# Patient Record
Sex: Female | Born: 1955 | Race: White | Hispanic: No | Marital: Married | Smoking: Never smoker
Health system: Southern US, Community
[De-identification: ages and names within clinical notes are randomized; demographics above are authoritative.]

## PROBLEM LIST (undated history)

## (undated) DIAGNOSIS — G8929 Other chronic pain: Secondary | ICD-10-CM

## (undated) DIAGNOSIS — M542 Cervicalgia: Secondary | ICD-10-CM

## (undated) DIAGNOSIS — M1990 Unspecified osteoarthritis, unspecified site: Secondary | ICD-10-CM

## (undated) DIAGNOSIS — F32A Depression, unspecified: Secondary | ICD-10-CM

## (undated) DIAGNOSIS — F419 Anxiety disorder, unspecified: Secondary | ICD-10-CM

## (undated) DIAGNOSIS — T7840XA Allergy, unspecified, initial encounter: Secondary | ICD-10-CM

## (undated) DIAGNOSIS — F329 Major depressive disorder, single episode, unspecified: Secondary | ICD-10-CM

## (undated) DIAGNOSIS — M549 Dorsalgia, unspecified: Secondary | ICD-10-CM

## (undated) DIAGNOSIS — I10 Essential (primary) hypertension: Secondary | ICD-10-CM

## (undated) DIAGNOSIS — E7800 Pure hypercholesterolemia, unspecified: Secondary | ICD-10-CM

## (undated) DIAGNOSIS — K649 Unspecified hemorrhoids: Secondary | ICD-10-CM

## (undated) DIAGNOSIS — M810 Age-related osteoporosis without current pathological fracture: Secondary | ICD-10-CM

## (undated) DIAGNOSIS — K219 Gastro-esophageal reflux disease without esophagitis: Secondary | ICD-10-CM

## (undated) HISTORY — DX: Unspecified hemorrhoids: K64.9

## (undated) HISTORY — DX: Allergy, unspecified, initial encounter: T78.40XA

## (undated) HISTORY — DX: Other chronic pain: G89.29

## (undated) HISTORY — PX: SPINE SURGERY: SHX786

## (undated) HISTORY — PX: CHOLECYSTECTOMY: SHX55

## (undated) HISTORY — DX: Age-related osteoporosis without current pathological fracture: M81.0

## (undated) HISTORY — PX: NECK SURGERY: SHX720

## (undated) HISTORY — PX: APPENDECTOMY: SHX54

## (undated) HISTORY — DX: Unspecified osteoarthritis, unspecified site: M19.90

## (undated) HISTORY — PX: ABDOMINAL HYSTERECTOMY: SHX81

## (undated) HISTORY — PX: HERNIA REPAIR: SHX51

---

## 1998-03-10 ENCOUNTER — Ambulatory Visit (HOSPITAL_COMMUNITY): Admission: RE | Admit: 1998-03-10 | Discharge: 1998-03-12 | Payer: Self-pay | Admitting: Neurosurgery

## 1998-04-26 ENCOUNTER — Encounter: Admission: RE | Admit: 1998-04-26 | Discharge: 1998-07-25 | Payer: Self-pay | Admitting: Neurosurgery

## 1998-05-03 ENCOUNTER — Ambulatory Visit (HOSPITAL_COMMUNITY): Admission: RE | Admit: 1998-05-03 | Discharge: 1998-05-03 | Payer: Self-pay | Admitting: Neurosurgery

## 1998-06-12 ENCOUNTER — Encounter: Admission: RE | Admit: 1998-06-12 | Discharge: 1998-09-10 | Payer: Self-pay | Admitting: Neurosurgery

## 2000-01-31 ENCOUNTER — Ambulatory Visit (HOSPITAL_BASED_OUTPATIENT_CLINIC_OR_DEPARTMENT_OTHER): Admission: RE | Admit: 2000-01-31 | Discharge: 2000-01-31 | Payer: Self-pay | Admitting: Surgery

## 2000-04-07 ENCOUNTER — Encounter: Payer: Self-pay | Admitting: Neurosurgery

## 2000-04-07 ENCOUNTER — Ambulatory Visit (HOSPITAL_COMMUNITY): Admission: RE | Admit: 2000-04-07 | Discharge: 2000-04-07 | Payer: Self-pay | Admitting: Neurosurgery

## 2000-07-22 ENCOUNTER — Encounter (INDEPENDENT_AMBULATORY_CARE_PROVIDER_SITE_OTHER): Payer: Self-pay | Admitting: Specialist

## 2000-07-22 ENCOUNTER — Other Ambulatory Visit: Admission: RE | Admit: 2000-07-22 | Discharge: 2000-07-22 | Payer: Self-pay | Admitting: Obstetrics and Gynecology

## 2000-07-22 ENCOUNTER — Encounter: Payer: Self-pay | Admitting: Obstetrics and Gynecology

## 2000-07-22 ENCOUNTER — Ambulatory Visit (HOSPITAL_COMMUNITY): Admission: RE | Admit: 2000-07-22 | Discharge: 2000-07-22 | Payer: Self-pay | Admitting: Obstetrics and Gynecology

## 2000-09-15 ENCOUNTER — Other Ambulatory Visit: Admission: RE | Admit: 2000-09-15 | Discharge: 2000-09-15 | Payer: Self-pay | Admitting: Obstetrics and Gynecology

## 2000-09-24 ENCOUNTER — Encounter (INDEPENDENT_AMBULATORY_CARE_PROVIDER_SITE_OTHER): Payer: Self-pay | Admitting: Specialist

## 2000-09-24 ENCOUNTER — Inpatient Hospital Stay (HOSPITAL_COMMUNITY): Admission: RE | Admit: 2000-09-24 | Discharge: 2000-09-25 | Payer: Self-pay | Admitting: Obstetrics and Gynecology

## 2001-06-16 ENCOUNTER — Encounter: Payer: Self-pay | Admitting: *Deleted

## 2001-06-16 ENCOUNTER — Ambulatory Visit (HOSPITAL_COMMUNITY): Admission: RE | Admit: 2001-06-16 | Discharge: 2001-06-16 | Payer: Self-pay | Admitting: *Deleted

## 2001-12-10 ENCOUNTER — Ambulatory Visit (HOSPITAL_COMMUNITY): Admission: RE | Admit: 2001-12-10 | Discharge: 2001-12-10 | Payer: Self-pay | Admitting: Internal Medicine

## 2001-12-10 ENCOUNTER — Encounter: Payer: Self-pay | Admitting: Internal Medicine

## 2002-01-05 ENCOUNTER — Ambulatory Visit (HOSPITAL_COMMUNITY): Admission: RE | Admit: 2002-01-05 | Discharge: 2002-01-05 | Payer: Self-pay | Admitting: Internal Medicine

## 2002-01-18 ENCOUNTER — Ambulatory Visit (HOSPITAL_COMMUNITY): Admission: RE | Admit: 2002-01-18 | Discharge: 2002-01-18 | Payer: Self-pay | Admitting: Internal Medicine

## 2002-01-18 ENCOUNTER — Encounter (INDEPENDENT_AMBULATORY_CARE_PROVIDER_SITE_OTHER): Payer: Self-pay | Admitting: Internal Medicine

## 2002-09-17 ENCOUNTER — Encounter: Payer: Self-pay | Admitting: Internal Medicine

## 2002-09-17 ENCOUNTER — Ambulatory Visit (HOSPITAL_COMMUNITY): Admission: RE | Admit: 2002-09-17 | Discharge: 2002-09-17 | Payer: Self-pay | Admitting: Internal Medicine

## 2003-03-24 ENCOUNTER — Encounter: Payer: Self-pay | Admitting: Family Medicine

## 2003-03-24 ENCOUNTER — Ambulatory Visit (HOSPITAL_COMMUNITY): Admission: RE | Admit: 2003-03-24 | Discharge: 2003-03-24 | Payer: Self-pay | Admitting: Family Medicine

## 2004-07-03 ENCOUNTER — Emergency Department (HOSPITAL_COMMUNITY): Admission: EM | Admit: 2004-07-03 | Discharge: 2004-07-03 | Payer: Self-pay | Admitting: Emergency Medicine

## 2004-07-14 ENCOUNTER — Emergency Department (HOSPITAL_COMMUNITY): Admission: EM | Admit: 2004-07-14 | Discharge: 2004-07-14 | Payer: Self-pay | Admitting: Emergency Medicine

## 2004-07-31 ENCOUNTER — Emergency Department (HOSPITAL_COMMUNITY): Admission: EM | Admit: 2004-07-31 | Discharge: 2004-07-31 | Payer: Self-pay | Admitting: Emergency Medicine

## 2004-12-11 ENCOUNTER — Encounter: Admission: RE | Admit: 2004-12-11 | Discharge: 2004-12-11 | Payer: Self-pay | Admitting: Family Medicine

## 2004-12-24 ENCOUNTER — Encounter: Admission: RE | Admit: 2004-12-24 | Discharge: 2004-12-24 | Payer: Self-pay | Admitting: Family Medicine

## 2005-02-05 ENCOUNTER — Encounter: Admission: RE | Admit: 2005-02-05 | Discharge: 2005-02-05 | Payer: Self-pay | Admitting: Family Medicine

## 2005-10-07 ENCOUNTER — Ambulatory Visit (HOSPITAL_COMMUNITY): Admission: RE | Admit: 2005-10-07 | Discharge: 2005-10-07 | Payer: Self-pay | Admitting: Neurosurgery

## 2006-02-19 ENCOUNTER — Encounter: Admission: RE | Admit: 2006-02-19 | Discharge: 2006-02-19 | Payer: Self-pay | Admitting: Family Medicine

## 2006-07-22 ENCOUNTER — Ambulatory Visit (HOSPITAL_COMMUNITY): Admission: RE | Admit: 2006-07-22 | Discharge: 2006-07-22 | Payer: Self-pay | Admitting: Family Medicine

## 2006-08-13 ENCOUNTER — Ambulatory Visit (HOSPITAL_COMMUNITY): Admission: RE | Admit: 2006-08-13 | Discharge: 2006-08-13 | Payer: Self-pay | Admitting: Neurosurgery

## 2007-02-23 ENCOUNTER — Encounter: Admission: RE | Admit: 2007-02-23 | Discharge: 2007-02-23 | Payer: Self-pay | Admitting: Family Medicine

## 2007-08-25 ENCOUNTER — Emergency Department (HOSPITAL_COMMUNITY): Admission: EM | Admit: 2007-08-25 | Discharge: 2007-08-25 | Payer: Self-pay | Admitting: Emergency Medicine

## 2007-10-15 ENCOUNTER — Ambulatory Visit (HOSPITAL_COMMUNITY): Admission: RE | Admit: 2007-10-15 | Discharge: 2007-10-15 | Payer: Self-pay | Admitting: Family Medicine

## 2007-11-17 ENCOUNTER — Ambulatory Visit (HOSPITAL_COMMUNITY): Admission: RE | Admit: 2007-11-17 | Discharge: 2007-11-17 | Payer: Self-pay | Admitting: Family Medicine

## 2007-11-26 HISTORY — PX: COLONOSCOPY: SHX174

## 2007-11-26 HISTORY — PX: ESOPHAGOGASTRODUODENOSCOPY: SHX1529

## 2008-01-04 ENCOUNTER — Ambulatory Visit (HOSPITAL_COMMUNITY): Admission: RE | Admit: 2008-01-04 | Discharge: 2008-01-04 | Payer: Self-pay | Admitting: Family Medicine

## 2008-01-28 ENCOUNTER — Ambulatory Visit (HOSPITAL_COMMUNITY): Admission: RE | Admit: 2008-01-28 | Discharge: 2008-01-28 | Payer: Self-pay | Admitting: Family Medicine

## 2008-03-13 ENCOUNTER — Emergency Department (HOSPITAL_COMMUNITY): Admission: EM | Admit: 2008-03-13 | Discharge: 2008-03-13 | Payer: Self-pay | Admitting: Emergency Medicine

## 2008-04-01 ENCOUNTER — Ambulatory Visit: Payer: Self-pay | Admitting: Internal Medicine

## 2008-04-05 ENCOUNTER — Ambulatory Visit: Payer: Self-pay | Admitting: Internal Medicine

## 2008-04-05 ENCOUNTER — Ambulatory Visit (HOSPITAL_COMMUNITY): Admission: RE | Admit: 2008-04-05 | Discharge: 2008-04-05 | Payer: Self-pay | Admitting: Internal Medicine

## 2008-05-04 ENCOUNTER — Ambulatory Visit: Payer: Self-pay | Admitting: Internal Medicine

## 2008-05-06 ENCOUNTER — Encounter (HOSPITAL_COMMUNITY): Admission: RE | Admit: 2008-05-06 | Discharge: 2008-06-05 | Payer: Self-pay | Admitting: Internal Medicine

## 2008-05-18 ENCOUNTER — Encounter (INDEPENDENT_AMBULATORY_CARE_PROVIDER_SITE_OTHER): Payer: Self-pay | Admitting: General Surgery

## 2008-05-18 ENCOUNTER — Observation Stay (HOSPITAL_COMMUNITY): Admission: RE | Admit: 2008-05-18 | Discharge: 2008-05-19 | Payer: Self-pay | Admitting: General Surgery

## 2009-03-13 ENCOUNTER — Ambulatory Visit (HOSPITAL_COMMUNITY): Admission: RE | Admit: 2009-03-13 | Discharge: 2009-03-13 | Payer: Self-pay | Admitting: Family Medicine

## 2009-05-05 ENCOUNTER — Emergency Department (HOSPITAL_COMMUNITY): Admission: EM | Admit: 2009-05-05 | Discharge: 2009-05-05 | Payer: Self-pay | Admitting: Emergency Medicine

## 2009-11-11 ENCOUNTER — Emergency Department (HOSPITAL_COMMUNITY): Admission: EM | Admit: 2009-11-11 | Discharge: 2009-11-11 | Payer: Self-pay | Admitting: Emergency Medicine

## 2010-03-15 ENCOUNTER — Ambulatory Visit (HOSPITAL_COMMUNITY): Admission: RE | Admit: 2010-03-15 | Discharge: 2010-03-15 | Payer: Self-pay | Admitting: Family Medicine

## 2010-05-11 ENCOUNTER — Ambulatory Visit (HOSPITAL_COMMUNITY): Admission: RE | Admit: 2010-05-11 | Discharge: 2010-05-11 | Payer: Self-pay | Admitting: Family Medicine

## 2010-06-12 IMAGING — NM NM HEPATO W/GB/PHARM/[PERSON_NAME]
2 series · 12 of 12 positions shown · non-contrast
Comparison: No comparison nuclear medicine examination.

CLINICAL DATA: Abdominal pain.

NUCLEAR MEDICINE HEPATOBILIARY IMAGING WITH GALLBLADDER EF
TECHNIQUE: Sequential images of the abdomen were obtained [DATE] minutes following intravenous administration of
radiopharmaceutical. After oral ingestion of 8oz of half and half
cream, gallbladder ejection fraction was determined.
Radiopharmaceutical:  S.Om3i Wc-88m Choletec

[Series 1: hepatobiliary · 3.20mm/px · 6 of 60 frames shown (1 of 2)]
[frame 6/60]
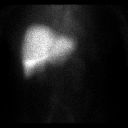
[frame 16/60]
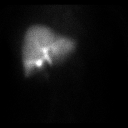
[frame 26/60]
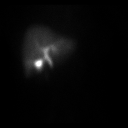
[frame 36/60]
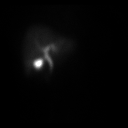
[frame 46/60]
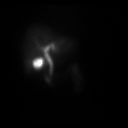
[frame 56/60]
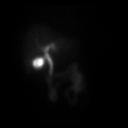

[Series 1: hepatobiliary · 3.20mm/px · 6 of 60 frames shown (2 of 2)]
[frame 6/60]
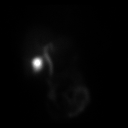
[frame 16/60]
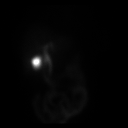
[frame 26/60]
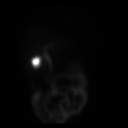
[frame 36/60]
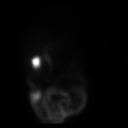
[frame 46/60]
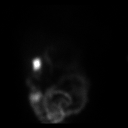
[frame 56/60]
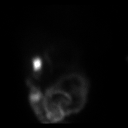

[12 of 12 positions shown; findings below may reference images not displayed]

FINDINGS: Homogeneous uptake by the liver.  Gallbladder visualized
by 20 minutes.  Small bowel visualized by 45 minutes.

After ingestion of 8 ounces of half and half, calculated ejection
fraction is reported as 44.5% however, upon reviewing the graph,
calculated ejection fraction appears to be 50% which is borderline.
IMPRESSION: Borderline gallbladder ejection fraction of approximately 50%
(normal gallbladder ejection fraction is over 50% at 60 minutes).

## 2010-12-16 ENCOUNTER — Encounter: Payer: Self-pay | Admitting: Family Medicine

## 2011-02-10 LAB — CREATININE, SERUM
Creatinine, Ser: 0.95 mg/dL (ref 0.4–1.2)
GFR calc non Af Amer: >60 mL/min (ref >60)

## 2011-03-04 LAB — COMPREHENSIVE METABOLIC PANEL
ALT: 14 U/L (ref 0–35)
Alkaline Phosphatase: 78 U/L (ref 39–117)
CO2: 29 mEq/L (ref 19–32)
Chloride: 103 mEq/L (ref 96–112)
GFR calc non Af Amer: >60 mL/min (ref >60)
Glucose, Bld: 101 mg/dL — ABNORMAL HIGH (ref 70–99)
Potassium: 2.9 mEq/L — ABNORMAL LOW (ref 3.5–5.1)
Sodium: 138 mEq/L (ref 135–145)
Total Protein: 7.4 g/dL (ref 6.0–8.3)

## 2011-03-04 LAB — DIFFERENTIAL
Basophils Relative: 0 % (ref 0–1)
Eosinophils Absolute: 0 10*3/uL (ref 0.0–0.7)
Monocytes Relative: 5 % (ref 3–12)
Neutrophils Relative %: 72 % (ref 43–77)

## 2011-03-04 LAB — TYPE AND SCREEN

## 2011-03-04 LAB — CBC
Hemoglobin: 13.7 g/dL (ref 12.0–15.0)
RBC: 4.41 MIL/uL (ref 3.87–5.11)

## 2011-03-04 LAB — PROTIME-INR: INR: 1 (ref 0.00–1.49)

## 2011-04-09 NOTE — Consult Note (Signed)
Stephanie Stephanie [name], Stephanie [name]                  ACCOUNT NO.:  [id number]   MEDICAL RECORD NO.:  [id number]          PATIENT TYPE:  AMB   LOCATION:  DAY                           FACILITY:  [facility]   PHYSICIAN:  R. Roetta Sessions, M.D. DATE OF BIRTH:  [birth year]   DATE OF CONSULTATION:  04/01/2008  DATE OF DISCHARGE:                                 CONSULTATION   REFERRING PHYSICIAN:  Dr. Nobie Putnam   REASON FOR CONSULTATION:  Abdominal bloating, nausea, weight gain, and  constipation.   HISTORY OF PRESENT ILLNESS:  Stephanie Stephanie [name] is a 55 year old female.  She  complains of abdominal bloating for about 11 months.  Now, she has  gained about 20 pounds.  She has chronic nausea, she is usually worse  first thing in the morning.  She is constipated.  She has been taken off  her gabapentin as well as anxiety pills, diazepam, and Paxil.  She has  had a recent TSH, which has been normal.  She has had workup for  abdominal pain thus far, which has included a CT scan of the abdomen and  pelvis with and without contrast, which showed atypical retroperitoneal  cyst on the right possibly arising from the left pole of the right  kidney, nonobstructing calculus in the lower pole of the right kidney,  air in the vagina nonspecific.  She also had an abdominal ultrasound,  which was unremarkable.  She had a water soluble enema, which showed no  evidence of rectovaginal fistula.  She had a chest x-ray, which showed  mild chronic bronchitic changes.  She generally has a daily soft brown  bowel movement.  Denies any rectal bleeding or melena.  She is having  heartburn and indigestion, which have been worse lately.  She has tried  Tums, which does not seem to help completely.   PAST MEDICAL AND SURGICAL HISTORY:  Hypertension.  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  She has had cervical disk surgery.  She has a  history of insomnia and hypertension.  She has had tubal ligation.  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  She is status post  appendectomy.  She has had a benign mass removed from her axilla.  She  is status post partial hysterectomy.  Chronic bronchitis.   CURRENT MEDICATIONS:  Over-the-counter Benadryl nightly.   FAMILY HISTORY:  Positive for maternal grandfather with colon cancer.  Mother with colonic polyps at age 21 and esophagus problems.  Father  has history of stroke or CVA.  One daughter with a heart condition that  she does not know the name.  She has 3 healthy children.   SOCIAL HISTORY:  Stephanie Stephanie [name] is married.  She has 2 healthy children.  She  is disabled.  She denies any tobacco or alcohol use.  She has a remote  history of marijuana use, has not used in months.  She denies any  intranasal or IV drugs.   REVIEW OF SYSTEMS:  See HPI.  Otherwise negative.   PHYSICAL EXAMINATION:  VITAL SIGNS:  Weight 151 pounds, height 62  inches, temperature 92, blood pressure 120/78, and pulse 72.  GENERAL:  Stephanie Stephanie [name] is a well-developed, well-nourished Caucasian female  in no acute distress.  HEENT:  Sclerae nonicteric.  Conjunctivae pink.  Oropharynx pink and  moist without lesions.  NECK:  Supple without mass or thyromegaly.  CHEST:  Heart regular rate and rhythm.  No S1 and S2.  No murmurs,  clicks, rubs, or gallops.  LUNGS:  Clear to auscultation bilaterally.  ABDOMEN:  Positive bowel sounds x4.  No bruits auscultated.  Soft,  nontender, and nondistended without palpable mass or hepatosplenomegaly.  No interstitial guarding.  EXTREMITIES:  No clubbing or edema bilaterally.  SKIN:  Pink, warm, and dry without any rash or jaundice.   LABORATORY STUDIES:  December 31, 2007:  CBC; WBC 7.0, hemoglobin 13.6,  hematocrit 41.4, and platelets 254.  MET-7 normal.  Normal TSH.  Normal  LFTs.   ASSESSMENT:  Stephanie Stephanie [name] is a 55 year old female with a 80-month history of  abdominal bloating along with chronic nausea and weight gain.  She has  chronic constipation as well.  She is  euthyroid.  I suspect much of her  weight gain may be due to the fact that she has discontinued her Paxil  on her own.  She has chronic nausea, which should be evaluated further  to rule out peptic ulcer disease.  She also has worsening heartburn and  indigestion, nausea could be related to GERD/gastritis.  As far as her  abdominal bloating concerned, I suspect this is secondary to  constipation.   PLAN:  1. Colonoscopy and EGD with Dr. Jena Gauss in near future.  Discussed this      procedure including risks and benefits which include but not      limited to bleeding, infection, perforation, and drug reaction, she      agrees and informed consent was obtained.  2. She is not wanting to take medications at this point.  She may      benefit from PPI.  However, her concern is that she wants to avoid      all long-term medications if at all possible.   Thank you Dr. Nobie Putnam for letting us participate in the care of Ms.  [name].      Lorenza Burton, N.P.      Jonathon Bellows, M.D.  Electronically Signed    KJ/MEDQ  D:  04/01/2008  T:  04/02/2008  Job:  [id number]   cc:   Patrica Duel, M.D.  Fax: [phone]

## 2011-04-09 NOTE — Op Note (Signed)
NAMECORTNEE, Stephanie [name]                  ACCOUNT NO.:  [id number]   MEDICAL RECORD NO.:  [id number]          PATIENT TYPE:  AMB   LOCATION:  DAY                           FACILITY:  [facility]   PHYSICIAN:  R. Roetta Sessions, M.D. DATE OF BIRTH:  [birth year]   DATE OF PROCEDURE:  04/05/2008  DATE OF DISCHARGE:                               OPERATIVE REPORT   INDICATIONS FOR PROCEDURE:  A 55 year old lady with history of abdominal  bloating, chronic nausea setting of progressive weight gain.  She has  positive family history of colon cancer in her maternal grandfather.  Last colonoscopy for hematochezia was done in 1998 by Dr. Loreta Ave.  She had  dysphagia for which she underwent EGD back in 2003.  A 54-French Maloney  dilator was passed empirically.  No obstructive lesion was found.  EGD  and colonoscopy are being done.  This approach was discussed with the  patient at length.  Potential risks, benefits, alternatives, and  limitations have been reviewed previously with the son.  All questions  were answered and all part is agreeable.   PROCEDURE NOTE:  O2 saturation, blood pressure, pulse, and respirations  were monitored throughout the entire procedure.   CONSCIOUS SEDATION:  Versed 6 mg IV, Demerol 150 mg IV, and Phenergan  12.5 mg to augment conscious sedation.  Cetacaine spray for topical  oropharyngeal anesthesia.   INSTRUMENT:  Pentax video chip system.   EGD FINDINGS:  Examination of the tubular esophagus revealed no mucosal  abnormalities.  EG junction was easily traversed.  Stomach:  Gastric  cavity was emptied empty and insufflated well with air.  A thorough  examination of the gastric mucosa including retroflexed view of the  proximal stomach, esophagogastric junction demonstrated only a small  hiatal hernia.  Pylorus was patent and easily traversed.  Examination of  the bulb and second portion revealed no abnormalities.  Therapeutic/diagnostic maneuvers performed:  None.  The patient  tolerated the procedure well and was prepared for colonoscopy.  Digital  rectal exam revealed no abnormalities.  Endoscopic findings:  Prep was  adequate. Colon:  Colonic mucosa was surveyed from the rectosigmoid  junction through the left transverse, right colon to the appendiceal  orifice, ileocecal valve, and cecum.  These structures were well seen  and photographed for the record.  Terminal ileum was intubated to 10 cm.  From this level, the scope was slowly and cautiously withdrawn.  All  previously mentioned mucosal surfaces were again seen.  The colonic  mucosa appeared normal as did the terminal ileum mucosa.  The scope was  pulled down to the rectum with thorough examination of rectal mucosa,  including retroflexed view of the anal verge, demonstrated no  abnormalities.  The patient tolerated both procedures well and was  reacted in endoscopy.   IMPRESSION:  1. Esophagogastroduodenoscopy:  Normal esophagus, small hiatal hernia,      otherwise normal stomach, D1 and D2.  2. Colonoscopy findings:  Normal rectum, colon, and terminal ileum.   I suspect that the patient is maybe experiencing an element of  gastroesophageal reflux disease  manifesting his nausea in the setting of  a progressive weight gain.  Constipation is likely functional.   RECOMMENDATIONS:  1. One month course of Prilosec 20 mg orally daily.  2. Prune juice daily or MiraLax 17 g orally at bedtime p.r.n.      constipation.  3. Follow up appointment with [place] in 1 month.      Jonathon Bellows, M.D.  Electronically Signed     RMR/MEDQ  D:  04/05/2008  T:  04/06/2008  Job:  [id number]   cc:   Madelin Rear. Sherwood Gambler, MD  Fax: [phone]

## 2011-04-09 NOTE — Op Note (Signed)
Stephanie [name], Stephanie [name]                  ACCOUNT NO.:  [id number]   MEDICAL RECORD NO.:  [id number]          PATIENT TYPE:  OBV   LOCATION:  A335                          FACILITY:  [facility]   PHYSICIAN:  Barbaraann Barthel, M.D. DATE OF BIRTH:  Nov 27, 1955   DATE OF PROCEDURE:  05/18/2008  DATE OF DISCHARGE:                               OPERATIVE REPORT   PREOPERATIVE DIAGNOSIS:  Cholecystitis secondary to biliary dyskinesia.   POSTOPERATIVE DIAGNOSIS:  Cholecystitis secondary to biliary dyskinesia.   PROCEDURE:  Laparoscopic cholecystectomy (no cholangiogram).   SPECIMEN:  Gallbladder.   NOTE:  This is a 55 year old white female who had an extensive GI workup  for right upper quadrant pain, nausea, and vomiting.  She had a biliary  ejection fraction that was 50%; however, she had a complete workup for  gallbladder disease, and it was still felt to be related to her  gallbladder due to her symptomatology.  She had right upper quadrant  pain, nausea and vomiting, and bloating.  Sonogram did not reveal any  evidence of cholelithiasis.  The patient underwent upper and lower GI  endoscopy as well as hepatobiliary scan.  We discussed the surgery with  her in detail as she did not improve with dietary manipulation, and we  discussed complications not limited to but including bleeding,  infection, damage to bile ducts, perforation of organs, transitory  diarrhea, and possibility of open surgery might be required.  Informed  consent was obtained.  We also discussed frankly that the results for  cholecystectomy for biliary dyskinesia are not as satisfying as those  for cholelithiasis.  All questions were answered.   GROSS OPERATIVE FINDINGS:  The patient had a varying sort of fibrotic  portion of Hartmann's pouch in the distal portion of the gallbladder and  a small cystic duct which was not cannulated in the right upper  quadrant.  The liver appeared mottled but normal, otherwise no other  abnormalities were observed in the right upper quadrant.   TECHNIQUE:  The patient was placed in the supine position.  After the  adequate administration of general anesthesia via endotracheal  intubation, Foley catheter was aseptically inserted.  She was prepped  with Betadine solution and draped in usual manner.  A periumbilical  incision was carried out over the superior aspect of the umbilicus.  The  incision was carried out here and the fascia was then grasped with a  sharp towel clip and elevated.  A Veress needle was inserted and  confirmed the position with a saline drop test.  We then placed an 11-mm  cannula in the umbilical incision using the Visiport technique and then  under direct vision another 11-mm cannula was placed in the epigastrium  and two 5-mm cannulas were placed in the right upper quadrant laterally.  The gallbladder was grasped.  Adhesions were taken down.  The cystic  duct was clearly visualized, triply silver clipped and divided as was  the cystic artery.  While grasping the gallbladder with a grasper, I  made a hole in the gallbladder and there was a little spillage.  This  was not a serious problem.  The gallbladder was removed uneventfully  from the liver bed.  Bleeding was controlled with a cautery device.  The  gallbladder was then removed using the Endosac device.  We then  irrigated with normal saline and then checked for hemostasis.  I elected  to place a piece of Surgicel in the liver bed and the Jackson-Pratt  drain which exited through lateral incisions.  The abdomen was then  desufflated.  We sutured the drain in place with 3-0 nylon, used 0.5%  Sensorcaine to anesthetize the dura and the port sites for postoperative  comfort, and closed the larger drain sites with 0 Polysorb and used the  stapling device for the skin.  Prior to closure, all sponge, needle, and  instrument counts were found to be correct.  Estimated blood loss was  minimal.  The  patient received 1 L of crystalloids intraoperatively.  The wound was classified as contaminated because of the minimal amount  of the biliary spill.  There were no complications.  The patient was  taken to recovery room in stable condition.      Barbaraann Barthel, M.D.  Electronically Signed     WB/MEDQ  D:  05/18/2008  T:  05/19/2008  Job:  [id number]   cc:   R. Roetta Sessions, M.D.  [address]  [place]  [place] [address]   Patrica Duel, M.D.  Fax: [phone]

## 2011-04-09 NOTE — Assessment & Plan Note (Signed)
NAMEJOLLY, Stephanie [name]                   CHART#:  [id number]   DATE:  05/04/2008                       DOB:  10-02-1956   PRIMARY CARE PHYSICIAN:  Patrica Duel, MD   CHIEF COMPLAINT:  Abdominal pain.   PROBLEM LIST:  1. Normal EGD by Dr. Jena Gauss on Apr 05, 2008.  2. Chronic abdominal bloating and nausea.  3. Chronic upper abdominal pain.  4. Normal colonoscopy and EGD by Dr. Jena Gauss on Apr 05, 2008.  5. Chronic constipation.  6. Hypertension.  7. Insomnia.  8. Status post appendectomy.  9. Status post partial hysterectomy.  10.Chronic bronchitis.  11.Benign axillary mass.  12.History of cervical disk surgery with chronic neck and back pain.   SUBJECTIVE:  The patient is a 55 year old Caucasian female who presents  today for followup EGD and colonoscopy with a history of chronic  abdominal pain and bloating.  She continues to complain of a mid  abdominal pain.  She rates 9/10 on a pain scale.  She feels as though  her insides are ripping apart.  She has a significant amount of gas.  She has a bowel movement generally every morning at this point in time  and can go up to 3-4 stools per day.  She denies any rectal bleeding or  melena.  Her pain is not fully associated with meals.  At times, she  finds it hard to breathe.  The pain does seem to get worse with  movement.  She denies any fever.  Denies any chills.  She does have  daily nausea and does vomit at least a couple of times per week.  She  has chronic neck and back pain.  She does take Prilosec 20 mg daily.   CURRENT MEDICATIONS:  See the list from May 04, 2008.   ALLERGIES:  PREDNISONE.   OBJECTIVE:  VITAL SIGNS:  Weight 148 pounds, height 51 inches,  temperature 98.1, blood pressure 110/80, and pulse 80.  GENERAL:  The patient is a well-developed, well-nourished Caucasian  female in no acute distress.  HEENT:  Sclerae clear, nonicteric. Conjunctivae pink.  Oropharynx is  pink and moist without any lesions.  NECK:  Supple.   No mass or thyromegaly.  CHEST:  Heart has regular rate and rhythm.  Normal S1 and S2 without  murmurs, clicks, rubs, or gallops.  ABDOMEN:  Positive bowel sounds x4.  No bruits auscultated.  Soft and  nondistended.  She does have Murphy point tenderness as well as  tenderness to the upper abdomen and around the umbilicus.  There is no  rebound tenderness or guarding.  Negative Carnett sign.  EXTREMITIES:  There is no lower extremity edema or clubbing.   DIAGNOSTIC WORKUP:  Thus far has included chest x-ray on January 28, 2008,  which showed mild chronic bronchitic changes; a limited-contrast enema,  which showed no evidence of rectovaginal fistula; abdominal ultrasound  on January 28, 2008, which was unremarkable; a CT of abdomen with and  without contrast on January 04, 2008, atypical retroperitoneal cyst in  the right possibly arising from the lower pole of the right kidney, no  obstructive calculus in the lower pole of the right kidney, air in the  vagina.  She had normal CBC, MET-7, TSH, and LFTs on December 31, 2007.   ASSESSMENT:  The  patient is a 55 year old Caucasian female with history  of irritable bowel syndrome.  Her main concerns today are upper-to-mid  abdominal pain, nausea, and vomiting, suspicious for gallbladder  disease.  She does have irritable bowel syndrome, which could be  contributing to some of her bloating symptoms.  Other possibilities  would include small bowel bacterial overgrowth and celiac disease.   PLAN:  1. HIDA scan.  2. Continue Prilosec 20 mg daily.  3. Pending HIDA scan, we would suggest trial of Align once daily.  4. If no relief with this and gas-bloat diet, would pursue hydrogen      breath testing for small bowel bacterial overgrowth.  5. Gas-bloat literature pending HIDA scan.       Lorenza Burton, N.P.  Electronically Signed     R. Roetta Sessions, M.D.  Electronically Signed    KJ/MEDQ  D:  05/05/2008  T:  05/06/2008  Job:  [id number]    cc:   Patrica Duel, M.D.

## 2011-04-12 NOTE — Op Note (Signed)
[place]. [facility]  Patient:    Stephanie [name], Stephanie [name]                         MRN: [id number] Proc. Date: 01/31/00 Adm. Date:  [id number] Attending:  Charlton Haws CC:         Drs. Susco and Laban Emperor                           Operative Report  CCS# [id number]  PREOPERATIVE DIAGNOSIS:  Left axillary mass.  POSTOPERATIVE DIAGNOSIS:  Left axillary mass.  OPERATION PERFORMED:  Excision of left axillary mass.  SURGEON:  Currie Paris, M.D.  ANESTHESIA:  MAC.  INDICATIONS FOR PROCEDURE:  The patient has presented with a small axillary mass. She has had what appears to be some prominence of the axillary tissue for quite a while but now has had a palpable fairly superficial left axillary mass. Mammography failed to show any abnormality.  On exam I thought that she had in addition to the mass a fair amount of axillary breast tissue which she noted had been tender and giving her discomfort for a while.  DESCRIPTION OF PROCEDURE:  The patient was brought to the operating room and the area in question was identified and marked.  She was then given IV sedation. The axillary area was prepped and draped.  It was anesthetized with 1% Xylocaine.  made an incision and took a small ellipse of skin right over the mass since it as very superficial.  The area was excised sharply.  This was sent as a specimen labeled left axillary mass.  In addition, because she had a fair amount of axillary breast tissue, I excised a portion of this to be sure that I had actually gotten a good border around the mass but did not make a formal full excision of this axillary breast tissue.  Bleeders were electrocoagulated.  The wound was closed  with 3-0 Vicryl followed by 4-0 Monocryl subcuticular plus Steri-Strips.  The patient tolerated the procedure well.  There were no operative complications. ll counts were correct. DD:  01/31/00 TD:  01/31/00 Job:  [id number]

## 2011-04-12 NOTE — Op Note (Signed)
[facility]  Patient:    Stephanie [name], Stephanie [name]                      MRN: [id number] Proc. Date: 09/24/00 Adm. Date:  [id number] Disc. Date: [id number] Attending:  Conley Simmonds A                           Operative Report  PREOPERATIVE DIAGNOSES: 1. Chronic pelvic pain. 2. Menometrorrhagia. 3. Uterine leiomyomata.  POSTOPERATIVE DIAGNOSES: 1. Chronic pelvic pain. 2. Menometrorrhagia. 3. Uterine leiomyomata.  PROCEDURE:  Laparoscopically assisted vaginal hysterectomy, peritoneal biopsy.  SURGEON:  Conley Simmonds, M.D.  ASSISTANT:  Lodema Hong, M.D.  ANESTHESIA:  General endotracheal.  IV FLUIDS:  2300 cc Ringers lactate.  ESTIMATED BLOOD LOSS:  350 cc.  URINE OUTPUT:  500 cc.  COMPLICATIONS:  None.  INDICATIONS FOR PROCEDURE:  The patient was a 55 year old, gravida 89, para 2-1-3-2 Caucasian female, status post bilateral tubal ligation who had a history of chronic pelvic pain and menometrorrhagia which had been unresponsive to medical therapy. The patient had a previous pelvic ultrasound which documented multiple small uterine leiomyomata and endometrial lining measuring 3 mm. The left ovary was noted to be normal, and the right ovary was not seen. The patient had cervical cultures for gonorrhea and Chlamydia which were negative.  An endometrial biopsy showed secretory endometrium. The patient wished for a definitive surgical evaluation and treatment of her pain and bleeding, and she desired no future childbearing. The patient agreed to a laparoscopically assisted vaginal hysterectomy and potential removal of endometriosis after risks, benefits and alternatives were discussed with her.  FINDINGS:  Examination under anesthesia revealed a 9-10 week size globular uterus. No adnexal masses were appreciated. Laparoscopy demonstrated uterine leiomyomata. There was a left cornual 3 cm fibroids and a fundal intramural 2.5 cm fibroid. There was a left corpus  luteum cyst measuring 3 cm in diameter. The fallopian tubes were consistent with a prior bilateral tubal ligation, and a right Falope ring was identified. There were vesicular areas noted on the anterior abdominal wall which measured 2-4 mm in diameter, and a biopsy was taken. The liver and stomach were noted to be normal.  SPECIMENS:  The uterus and a peritoneal biopsy was sent to pathology separately.  DESCRIPTION OF PROCEDURE:  With an IV in place, PAS stockings and TED hose on, and after receiving Ancef 1 gm IV, the patient was taken to the operating room. The patient received general endotracheal anesthesia, and she was then placed in the dorsal lithotomy position. The abdomen and vagina were sterilely prepped, and a Foley catheter was sterilely placed inside the urinary bladder. The patient was then sterilely draped.  A speculum was placed in side the vagina, and a single tooth tenaculum was placed on the anterior cervical lip. This was replaced by a Hulka tenaculum and the remaining instruments were removed from the vagina.  The laparoscopic procedure began by making a vertical umbilical incision with the scalpel. This was carried down to the fascia using an Allis clamp. A 10 mm trocar was then inserted directly into the peritoneal cavity, and the laparoscope confirmed proper placement. A pneumoperitoneum was achieved with carbon dioxide gas, the patient was placed in the Trendelenburg position.  A 5 mm incision was created in each the right and left lower quadrant with a scalpel, and a 5 mm trocar was sequentially inserted through each under direct visualization of the laparoscope.  A blunt tip probe was next used to perform an inspection of the abdomen and pelvis, and the findings were as noted above.  The procedure began by taking down some congenital adhesions which were noted between the sigmoid colon and the left pelvic side wall near the area of the left  infundibulopelvic ligament. There was some slight oozing noted along the mesocolon at this location, it was cauterized with a bipolar cautery instrument for excellent hemostasis.  The procedure began by using bipolar cautery along the remaining proximal portion of the left fallopian tube which was then lysed with sharp dissection with scissors. The left utero-ovarian ligament and the left round ligament were then grasped with a tripolar instrument, cauterized, and divided sharply. The broad ligament was next grasped with the tripolar instrument, cauterized and divided. The leaf of the anterior and posterior broad ligament were then dissected out, and they were coagulated with the tripolar instrument and then cut. The same procedure that was performed on the patients left hand side was then repeated on the right hand side.  At this time, hemostasis was assured laparoscopically and a decision was made to proceed with the procedure vaginally.  A weighted speculum was placed inside the vagina and a Jacobs tenaculum and a single tooth tenaculum were placed on the cervix. The cervix was circumferentially injected with 0.5% xylocaine with 1:200,000 of epinephrine. The cervix was then scored with the scalpel in a circumferential fashion. The mucosa was dissected from the underlying endopelvic fascia surrounding the cervix using sharp dissection with the Mayo scissors. The posterior cul-de-sac was next entered sharply, and a weighted speculum was placed inside the posterior cul-de-sac. The uterosacral ligaments were then sequentially clamped, divided, and suture ligated with #0 Vicryl and the sutures were held. An additional bite was taken along the uterosacral ligaments more superiorly on each side bilaterally. These were clamped, divided, and again suture ligated using #0 Vicryl.  The anterior cul-de-sac was entered sharply at this time, and digital examination confirmed proper location in  the peritoneal cavity. The uterine arteries were then sequentially clamped, divided, and suture ligated with #0 Vicryl bilaterally. Next, the remaining portions of the broad ligament  bilaterally were clamped, divided and suture ligated with transfixing sutures of #0 Vicryl. This allowed the specimen to be freed, which was removed and sent to pathology.  There was some bleeding noted along the site of the uterosacral ligaments bilaterally. These were grasped with right angled clamps, and were suture ligated with figure-of-eight sutures of #0 Vicryl for excellent hemostasis. There was some bleeding noted along the edges of the vagina, which were cauterized inferiorly using monopolar cautery.  The pedicles were reexamined at this time, and hemostasis was noted to be good. A McCalls culdoplasty was performed at this time using #0 Vicryl suture, by placing a suture through the vaginal mucosa and into the cul-de-sac at the 5 oclock position, grasping the uterosacral ligaments on the patients left hand side, reaching across the posterior cul-de-sac, coming down through the right uterosacral ligament and then out the vagina at the 7 oclock position.  The vagina was closed with a running locked suture of #0 Vicryl at this time. The McCalls culdoplasty suture was tied, and there was excellent elevation of the vaginal vault.  Now with hemostasis assured from the vaginal incision, the laparoscope was reinserted in the peritoneal cavity, and the operative sites were re-examined. There was a small amount of bleeding noted along the right uterosacral ligament which was cauterized for  excellent hemostasis. A small peritoneal biopsy was performed in the anterior abdominal wall, and this was sent to pathology separately from the uterine specimen.  The pneumoperitoneum was released and all of the operative sites were reexamined. There was no evidence of any ongoing bleeding. The trocars were therefore  removed under visualization of the laparoscope and the pneumoperitoneum was released. The umbilical trocar and the laparoscope was removed simultaneously. All skin incisions were closed using inverted subcuticular sutures of 3-0 plain. The remaining Betadine was cleansed through the patients skin and Steri-Strips and Benzoin were placed over the incisions. The patient was taken out of the dorsal lithotomy position. She was awakened and extubated. She was escorted to the recovery room in stable and awaken condition. There were no complications to the procedure. All needle, instrument and lap counts were correct. DD:  09/24/00 TD:  09/25/00 Job: [id number]

## 2011-04-12 NOTE — H&P (Signed)
[facility]  Patient:    Stephanie [name], Stephanie [name]                        MRN: [id number] Adm. Date:  09/24/00 Attending:  Debbe Bales A. Edward Jolly, M.D.                         History and Physical  CHIEF COMPLAINT:  Chronic pelvic pain and menometrorrhagia.  HISTORY OF PRESENT ILLNESS:  The patient is a 55 year old, gravida 14, para 2-1-3-2 Caucasian female, status post bilateral tubal ligation, who presents with a history of chronic pelvic pain of years duration and menometrorrhagia of four months duration.  The patient reports that the chronic pelvic pain is a feeling of pressure.  She also reports dyspareunia which feels knife-like in nature.  The patient reports that she has amelioration of her pelvic pain when she is reclining.  The patient does have a history of chronic back pain for which she takes Lorcet and Naprosyn, and she reports that these medications do not alleviate her symptoms of pelvic pain.  The patient has reported heavy and irregular vaginal bleeding since July 2000.  She does require pad changes up to every 15 minutes.  The patient is unable to take oral contraceptive pills for treatment of her menometrorrhagia and chronic pelvic pain because of headaches which she has when she takes the active pills.  The patient desires no future childbearing.  Pelvic ultrasound performed on July 22, 2000, documented a uterine size of 9.9 x 5.8 x 6.1 cm.  There were several small fibroids which were noted throughout the uterus.  The posterior uterine segment had a fibroid measuring 17 mm in its largest diameter, and there was a left lower uterine segment fibroid measuring 13 mm in its largest diameter.  The endometrial stripe was measured at 3 mm.  The left ovary was noted to be normal and the right ovary was not seen.  No adnexal masses nor free fluid were noted.  An endometrial biopsy performed on July 22, 2000, documented secretory endometrium with  no evidence of hyperplasia nor malignancy.  The patient had a hematocrit of 36.4% on July 22, 2000, as well.  GC and Chlamydia cultures were negative on January 16, 2000.  The patient wishes for a definitive surgical evaluation and treatment of her chronic pelvic pain and menometrorrhagia.  PAST OBSTETRIC AND GYNECOLOGIC HISTORIES:  Remarkable for three spontaneous vaginal deliveries, one of which was at 6 months gestation and was followed by expiration of the fetus.  The patient is also status post spontaneous abortions x 3 for which she had a D&C with each miscarriage.  The patient has no history of abnormal Pap smears, and her Pap smear performed in November 2000 was within normal limits.  The patient does report a history of some postcoital spotting, and she denies a history of any sexually transmitted diseases.  The patient was recently diagnosed with bacterial vaginosis for which she is completing a course of Flagyl 500 mg p.o. b.i.d. x 1 week.  The patient is status post bilateral tubal ligation.  She is also status post possible laparoscopic ovarian cystectomy 10 to 20 years ago.  She is uncertain about the details of the procedure and whether or not she had a D&C at the same time.  The patients last mammogram was performed in January 2001 and was within normal limits.  PAST MEDICAL AND SURGICAL HISTORIES: 1.  Status post bilateral tubal ligation. 2. Status post dilation and curettage    x 3 for spontaneous abortions. 3. Status post ovarian cystectomy with possible concurrent dilation and    curettage. 4. Status post approximately. 5. Status post neck surgery which involved cervical spine fusion and placement    of a plate. 6. History of lumbar disk herniation.  MEDICATIONS: 1. Lorcet and Naprosyn p.r.n. 2. Flagyl 500 mg p.o. b.i.d.  ALLERGIES:  The patient reports that STEROIDS promote swelling and painful joints.  SOCIAL HISTORY:  The patient is married.  She is  on 100% disability secondary to her back pain.  She denies the use of tobacco, alcohol, or addictive drugs.  REVIEW OF SYSTEMS:  The patient denies any incontinence of urine with coughing or sneezing.  She reports some urinary urgency but no urge incontinence.  The patient denies constipation, diarrhea, or fecal incontinence.  PHYSICAL EXAMINATION:  VITAL SIGNS:  Blood pressure 120/70.  GENERAL:  The patient is a young Caucasian female who appears to be in discomfort when moving position.  NECK:  Negative for adenopathy and thyromegaly.  LUNGS:  Clear to auscultation bilaterally.  HEART:  Regular rate and rhythm.  There is no evidence of a murmur, rub, or gallop.  BREASTS:  Exam demonstrates the absence of dominant masses, skin retractions, nipple discharge, or axillary adenopathy.  ABDOMEN:  Exam demonstrates the abdomen to be soft and with evidence of mild suprapubic tenderness.  There is no evidence of any guarding or rebound. There is no evidence of hepatosplenomegaly or organomegaly.  There is evidence of a right lower quadrant incision which is consistent with the patients prior appendectomy.  PELVIC:  Exam demonstrates normal external genitalia and no evidence of inguinal adenopathy.  The urethra is within normal limits.  Speculum exam demonstrates a cervix and vagina without lesions.  Bimanual exam demonstrates the absence of cervical motion tenderness.  The uterus is 7 to 8 weeks size, anteverted, firm, slightly tender, and mobile.  No adnexal masses nor tenderness are appreciated.  RECTOVAGINAL:  Examination confirmed the bimanual exam, and the stool was noted to be guaiac negative.  ASSESSMENT AND PLAN:  My impression is that this patient is a 55 year old, gravida 71, para 2-1-3-2, Caucasian female, status post bilateral tubal ligation who has chronic pelvic pain and menometrorrhagia.  The patient does have a history of uterine fibroids documented on ultrasound.   The patients pain has not been adequately treated with nonsteroidal anti-inflammatory and  narcotic medications, and she is unable to tolerate other medical therapy such as oral contraceptive pills.  The patient wishes for definitive surgical evaluation and treatment, and she desires no future childbearing.  PLAN:  The patient will undergo a laparoscopically assisted vaginal hysterectomy on September 24, 2000, at [facility].  At the time of surgery, an evaluation and treatment of potential pelvic adhesions or endometriosis will be undertaken at the time of the hysterectomy procedure. The risks and benefits and alternatives have been discussed with the patient, and she chooses to proceed. DD:  09/23/00 TD:  09/23/00 Job: [id number]

## 2011-04-12 NOTE — Discharge Summary (Signed)
[facility]  Patient:    Stephanie [name], Stephanie [name]                      MRN: [id number] Adm. Date:  [id number] Disc. Date: [id number] Attending:  Conley Simmonds A                           Discharge Summary  ADMITTING DIAGNOSES: 1. Chronic pelvic pain. 2. Menometrorrhagia. 3. Uterine leiomyomata.  DISCHARGE DIAGNOSES: 1. Chronic pelvic pain. 2. Status post laparoscopically assisted vaginal hysterectomy and peritoneal    biopsy.  SIGNIFICANT OPERATIONS AND PROCEDURE:  Laparoscopically assisted vaginal hysterectomy with peritoneal biopsy under general anesthesia under the direction of Dr. Conley Simmonds at [facility] on September 24, 2000.  PERTINENT ADMISSION HISTORY AND PHYSICAL EXAMINATION:  The patient was a 56 year old gravida 6, para 2-1-3-2, Caucasian female, status post bilateral tubal ligation, who has had a history of chronic pelvic pain and menometrorrhagia.  The patient had an endometrial biopsy which documented secretory endometrium with no evidence of hyperplasia or malignancy.  Cervical cultures were negative for evidence of sexually transmitted disease.  Pelvic ultrasound documented multiple uterine leiomyomata.  The patients pain and bleeding was unresponsive to oral contraceptive therapy and Lorcet and Naprosyn which she was taking for chronic back pain.  The patient wished for a definitive surgical evaluation and treatment of her pain and bleeding, and she agreed to a laparoscopically assisted hysterectomy procedure and potential treatment of endometriosis after the risks, benefits, and alternatives were reviewed.  PHYSICAL EXAMINATION:  Documented a 9-10 weeks size globular uterus.  No adnexal masses were appreciated.  HOSPITAL COURSE:  The patient was admitted on September 24, 2000, at which time she underwent a laparoscopically assisted vaginal hysterectomy and peritoneal biopsy while under general anesthesia.  Estimated blood loss from  surgery was 350 cc, and there were no complications.  Findings at the time of surgery documented uterine leiomyomata of the left cornual region and the uterine corpus.  The fallopian tubes were consistent with a prior bilateral tubal ligation.  There was a left corpus luteum cyst noted.  There were fascicular lesions of the anterior abdominal wall in the mid to lower abdomen, and these were biopsied.  The patients postoperative course was unremarkable.  She had a morphine PC and Toradol for initial control of her pain, and this was adequate.  The patient was able to ambulate independently, take a regular diet, and empty her bladder prior to her discharge.  The patient did receive PAS and TED hose for DVT prophylaxis while in the hospital.  Her postoperative day #1 hematocrit was 29.4%, and the final pathology is pending at the time of her discharge. The incisions all remained without evidence of drainage or bleeding.  The patient is discharged to home in good condition.  She will take a regular diet.  For pain control, the patient is prescribed Percocet 1-2 p.o. q.4-6h. p.r.n. and Motrin 600 mg p.o. q.6h. p.r.n.  The patient will have decreased activity over the next four weeks.  She will take a multivitamin with iron. The patient will follow up in the office in four weeks.  She will call sooner if she experiences a temperature greater than 100.5 degrees Fahrenheit, nausea and vomiting, pain uncontrolled by her medication, drainage from her incision, or any other concerns. DD:  09/25/00 TD:  09/25/00 Job: [id number]

## 2011-08-20 LAB — CBC
HCT: 34.7 — ABNORMAL LOW
Hemoglobin: 12.4
RBC: 3.98
RDW: 14.3
WBC: 7.9

## 2011-08-20 LAB — URINALYSIS, ROUTINE W REFLEX MICROSCOPIC
Bilirubin Urine: NEGATIVE
Glucose, UA: NEGATIVE
Ketones, ur: NEGATIVE
pH: 5.5

## 2011-08-20 LAB — BASIC METABOLIC PANEL
GFR calc non Af Amer: 55 — ABNORMAL LOW
Glucose, Bld: 116 — ABNORMAL HIGH
Potassium: 3.5
Sodium: 139

## 2011-08-20 LAB — DIFFERENTIAL
Eosinophils Relative: 1
Lymphocytes Relative: 33
Lymphs Abs: 2.6
Monocytes Absolute: 0.4
Monocytes Relative: 5

## 2011-08-20 LAB — URINE MICROSCOPIC-ADD ON

## 2011-08-22 LAB — HEPATIC FUNCTION PANEL
ALT: 19
AST: 14
Albumin: 3.2 — ABNORMAL LOW
Alkaline Phosphatase: 77
Alkaline Phosphatase: 82
Bilirubin, Direct: 0.1
Indirect Bilirubin: 0.4
Total Bilirubin: 0.4
Total Protein: 6.6

## 2011-08-22 LAB — BASIC METABOLIC PANEL
BUN: 7
BUN: 7
CO2: 25
Calcium: 9.2
Chloride: 111
Creatinine, Ser: 0.9
GFR calc non Af Amer: >60
Glucose, Bld: 97
Potassium: 4.2
Potassium: 4.2
Sodium: 139

## 2011-08-22 LAB — DIFFERENTIAL
Basophils Relative: 0
Eosinophils Absolute: 0
Eosinophils Relative: 1
Lymphocytes Relative: 35
Lymphs Abs: 2
Lymphs Abs: 2
Monocytes Absolute: 0.6
Monocytes Relative: 5
Neutro Abs: 3.4

## 2011-08-22 LAB — CBC
HCT: 34.2 — ABNORMAL LOW
HCT: 37.3
Hemoglobin: 13
MCHC: 34.9
MCV: 86.6
Platelets: 235
Platelets: 243
RDW: 13.2
WBC: 10.5
WBC: 5.8

## 2011-08-22 LAB — AMYLASE: Amylase: 59

## 2011-12-02 DIAGNOSIS — Z6826 Body mass index (BMI) 26.0-26.9, adult: Secondary | ICD-10-CM | POA: Diagnosis not present

## 2011-12-02 DIAGNOSIS — K649 Unspecified hemorrhoids: Secondary | ICD-10-CM | POA: Diagnosis not present

## 2011-12-02 DIAGNOSIS — J019 Acute sinusitis, unspecified: Secondary | ICD-10-CM | POA: Diagnosis not present

## 2011-12-02 DIAGNOSIS — K219 Gastro-esophageal reflux disease without esophagitis: Secondary | ICD-10-CM | POA: Diagnosis not present

## 2012-04-06 DIAGNOSIS — J309 Allergic rhinitis, unspecified: Secondary | ICD-10-CM | POA: Diagnosis not present

## 2012-04-06 DIAGNOSIS — Z6826 Body mass index (BMI) 26.0-26.9, adult: Secondary | ICD-10-CM | POA: Diagnosis not present

## 2012-04-06 DIAGNOSIS — J209 Acute bronchitis, unspecified: Secondary | ICD-10-CM | POA: Diagnosis not present

## 2012-05-29 ENCOUNTER — Other Ambulatory Visit (HOSPITAL_COMMUNITY): Payer: Self-pay | Admitting: Internal Medicine

## 2012-05-29 ENCOUNTER — Other Ambulatory Visit (HOSPITAL_COMMUNITY): Payer: Self-pay | Admitting: Family Medicine

## 2012-05-29 DIAGNOSIS — Z139 Encounter for screening, unspecified: Secondary | ICD-10-CM

## 2012-06-01 ENCOUNTER — Ambulatory Visit (HOSPITAL_COMMUNITY)
Admission: RE | Admit: 2012-06-01 | Discharge: 2012-06-01 | Disposition: A | Discharge status: Home / Self Care | Payer: Medicare Other | Source: Ambulatory Visit | Attending: Family Medicine | Admitting: Family Medicine

## 2012-06-01 DIAGNOSIS — Z139 Encounter for screening, unspecified: Secondary | ICD-10-CM

## 2012-06-01 DIAGNOSIS — Z1231 Encounter for screening mammogram for malignant neoplasm of breast: Secondary | ICD-10-CM | POA: Diagnosis not present

## 2012-06-05 DIAGNOSIS — M545 Low back pain: Secondary | ICD-10-CM | POA: Diagnosis not present

## 2012-06-05 DIAGNOSIS — Z6826 Body mass index (BMI) 26.0-26.9, adult: Secondary | ICD-10-CM | POA: Diagnosis not present

## 2012-06-05 DIAGNOSIS — M5430 Sciatica, unspecified side: Secondary | ICD-10-CM | POA: Diagnosis not present

## 2012-11-30 DIAGNOSIS — J069 Acute upper respiratory infection, unspecified: Secondary | ICD-10-CM | POA: Diagnosis not present

## 2013-01-27 DIAGNOSIS — M542 Cervicalgia: Secondary | ICD-10-CM | POA: Diagnosis not present

## 2013-01-27 DIAGNOSIS — S139XXA Sprain of joints and ligaments of unspecified parts of neck, initial encounter: Secondary | ICD-10-CM | POA: Diagnosis not present

## 2013-01-27 DIAGNOSIS — Z6825 Body mass index (BMI) 25.0-25.9, adult: Secondary | ICD-10-CM | POA: Diagnosis not present

## 2013-02-01 DIAGNOSIS — E785 Hyperlipidemia, unspecified: Secondary | ICD-10-CM | POA: Diagnosis not present

## 2013-02-01 DIAGNOSIS — Z6825 Body mass index (BMI) 25.0-25.9, adult: Secondary | ICD-10-CM | POA: Diagnosis not present

## 2013-02-01 DIAGNOSIS — Z0000 Encounter for general adult medical examination without abnormal findings: Secondary | ICD-10-CM | POA: Diagnosis not present

## 2013-02-01 DIAGNOSIS — M159 Polyosteoarthritis, unspecified: Secondary | ICD-10-CM | POA: Diagnosis not present

## 2013-02-01 DIAGNOSIS — I10 Essential (primary) hypertension: Secondary | ICD-10-CM | POA: Diagnosis not present

## 2013-02-01 DIAGNOSIS — Z79899 Other long term (current) drug therapy: Secondary | ICD-10-CM | POA: Diagnosis not present

## 2013-02-01 DIAGNOSIS — M5412 Radiculopathy, cervical region: Secondary | ICD-10-CM | POA: Diagnosis not present

## 2013-02-11 ENCOUNTER — Other Ambulatory Visit (HOSPITAL_COMMUNITY): Payer: Self-pay | Admitting: Family Medicine

## 2013-02-11 DIAGNOSIS — D649 Anemia, unspecified: Secondary | ICD-10-CM | POA: Diagnosis not present

## 2013-02-11 DIAGNOSIS — Z6825 Body mass index (BMI) 25.0-25.9, adult: Secondary | ICD-10-CM | POA: Diagnosis not present

## 2013-02-11 DIAGNOSIS — Z139 Encounter for screening, unspecified: Secondary | ICD-10-CM

## 2013-02-11 DIAGNOSIS — E785 Hyperlipidemia, unspecified: Secondary | ICD-10-CM | POA: Diagnosis not present

## 2013-02-11 DIAGNOSIS — J019 Acute sinusitis, unspecified: Secondary | ICD-10-CM | POA: Diagnosis not present

## 2013-02-15 ENCOUNTER — Ambulatory Visit (HOSPITAL_COMMUNITY)
Admission: RE | Admit: 2013-02-15 | Discharge: 2013-02-15 | Disposition: A | Discharge status: Home / Self Care | Payer: Medicare Other | Source: Ambulatory Visit | Attending: Family Medicine | Admitting: Family Medicine

## 2013-02-15 DIAGNOSIS — Z780 Asymptomatic menopausal state: Secondary | ICD-10-CM | POA: Diagnosis not present

## 2013-02-15 DIAGNOSIS — Z13820 Encounter for screening for osteoporosis: Secondary | ICD-10-CM | POA: Diagnosis not present

## 2013-02-15 DIAGNOSIS — Z139 Encounter for screening, unspecified: Secondary | ICD-10-CM

## 2013-02-27 DIAGNOSIS — I10 Essential (primary) hypertension: Secondary | ICD-10-CM | POA: Diagnosis not present

## 2013-02-27 DIAGNOSIS — Z6826 Body mass index (BMI) 26.0-26.9, adult: Secondary | ICD-10-CM | POA: Diagnosis not present

## 2013-02-27 DIAGNOSIS — D508 Other iron deficiency anemias: Secondary | ICD-10-CM | POA: Diagnosis not present

## 2013-02-27 DIAGNOSIS — E785 Hyperlipidemia, unspecified: Secondary | ICD-10-CM | POA: Diagnosis not present

## 2013-10-22 ENCOUNTER — Other Ambulatory Visit (HOSPITAL_COMMUNITY): Payer: Self-pay | Admitting: Family Medicine

## 2013-10-22 ENCOUNTER — Ambulatory Visit (HOSPITAL_COMMUNITY)
Admission: RE | Admit: 2013-10-22 | Discharge: 2013-10-22 | Disposition: A | Discharge status: Home / Self Care | Payer: Medicare Other | Source: Ambulatory Visit | Attending: Family Medicine | Admitting: Family Medicine

## 2013-10-22 DIAGNOSIS — M79671 Pain in right foot: Secondary | ICD-10-CM

## 2013-10-22 DIAGNOSIS — Z23 Encounter for immunization: Secondary | ICD-10-CM | POA: Diagnosis not present

## 2013-10-22 DIAGNOSIS — Z6827 Body mass index (BMI) 27.0-27.9, adult: Secondary | ICD-10-CM | POA: Diagnosis not present

## 2013-10-22 DIAGNOSIS — M25579 Pain in unspecified ankle and joints of unspecified foot: Secondary | ICD-10-CM | POA: Diagnosis not present

## 2013-10-22 DIAGNOSIS — M79609 Pain in unspecified limb: Secondary | ICD-10-CM | POA: Diagnosis not present

## 2013-12-21 DIAGNOSIS — M545 Low back pain, unspecified: Secondary | ICD-10-CM | POA: Diagnosis not present

## 2013-12-21 DIAGNOSIS — M5137 Other intervertebral disc degeneration, lumbosacral region: Secondary | ICD-10-CM | POA: Diagnosis not present

## 2013-12-21 DIAGNOSIS — M546 Pain in thoracic spine: Secondary | ICD-10-CM | POA: Diagnosis not present

## 2013-12-21 DIAGNOSIS — E669 Obesity, unspecified: Secondary | ICD-10-CM | POA: Diagnosis not present

## 2013-12-30 DIAGNOSIS — M47817 Spondylosis without myelopathy or radiculopathy, lumbosacral region: Secondary | ICD-10-CM | POA: Diagnosis not present

## 2013-12-30 DIAGNOSIS — IMO0002 Reserved for concepts with insufficient information to code with codable children: Secondary | ICD-10-CM | POA: Diagnosis not present

## 2014-01-17 DIAGNOSIS — M545 Low back pain, unspecified: Secondary | ICD-10-CM | POA: Diagnosis not present

## 2014-01-17 DIAGNOSIS — M5137 Other intervertebral disc degeneration, lumbosacral region: Secondary | ICD-10-CM | POA: Diagnosis not present

## 2014-01-17 DIAGNOSIS — M47817 Spondylosis without myelopathy or radiculopathy, lumbosacral region: Secondary | ICD-10-CM | POA: Diagnosis not present

## 2014-01-17 DIAGNOSIS — E669 Obesity, unspecified: Secondary | ICD-10-CM | POA: Diagnosis not present

## 2014-02-03 DIAGNOSIS — K047 Periapical abscess without sinus: Secondary | ICD-10-CM | POA: Diagnosis not present

## 2014-02-03 DIAGNOSIS — Z6828 Body mass index (BMI) 28.0-28.9, adult: Secondary | ICD-10-CM | POA: Diagnosis not present

## 2014-02-24 ENCOUNTER — Ambulatory Visit (HOSPITAL_COMMUNITY): Payer: Medicare Other | Admitting: Physical Therapy

## 2014-03-03 ENCOUNTER — Telehealth (HOSPITAL_COMMUNITY): Payer: Self-pay

## 2014-03-03 ENCOUNTER — Ambulatory Visit (HOSPITAL_COMMUNITY): Payer: Medicare Other | Admitting: Physical Therapy

## 2014-03-04 DIAGNOSIS — I10 Essential (primary) hypertension: Secondary | ICD-10-CM | POA: Diagnosis not present

## 2014-03-04 DIAGNOSIS — Z6828 Body mass index (BMI) 28.0-28.9, adult: Secondary | ICD-10-CM | POA: Diagnosis not present

## 2014-03-04 DIAGNOSIS — G8929 Other chronic pain: Secondary | ICD-10-CM | POA: Diagnosis not present

## 2014-03-21 ENCOUNTER — Ambulatory Visit (HOSPITAL_COMMUNITY)
Admission: RE | Admit: 2014-03-21 | Discharge: 2014-03-21 | Disposition: A | Discharge status: Home / Self Care | Payer: Medicare Other | Source: Ambulatory Visit | Attending: Neurosurgery | Admitting: Neurosurgery

## 2014-03-21 DIAGNOSIS — M6281 Muscle weakness (generalized): Secondary | ICD-10-CM | POA: Diagnosis not present

## 2014-03-21 DIAGNOSIS — IMO0001 Reserved for inherently not codable concepts without codable children: Secondary | ICD-10-CM | POA: Diagnosis not present

## 2014-03-21 DIAGNOSIS — M5410 Radiculopathy, site unspecified: Secondary | ICD-10-CM | POA: Insufficient documentation

## 2014-03-21 DIAGNOSIS — M545 Low back pain, unspecified: Secondary | ICD-10-CM | POA: Diagnosis not present

## 2014-03-21 DIAGNOSIS — R262 Difficulty in walking, not elsewhere classified: Secondary | ICD-10-CM | POA: Diagnosis not present

## 2014-03-21 DIAGNOSIS — M2569 Stiffness of other specified joint, not elsewhere classified: Secondary | ICD-10-CM | POA: Diagnosis not present

## 2014-03-21 DIAGNOSIS — M2560 Stiffness of unspecified joint, not elsewhere classified: Secondary | ICD-10-CM

## 2014-03-21 DIAGNOSIS — R29898 Other symptoms and signs involving the musculoskeletal system: Secondary | ICD-10-CM | POA: Insufficient documentation

## 2014-03-21 NOTE — Evaluation (Signed)
Physical Therapy Evaluation  Patient Details  Name: Stephanie [name] MRN: [id number] Date of Birth: 1956/08/31  Today's Date: 03/21/2014 Time: 1515-1600 PT Time Calculation (min): 45 min Charge:  Evaluation              Visit#: 1 of 8  Re-eval: 04/20/14 Assessment Diagnosis: lumbar pain   Authorization: medicare     Past Medical History: No past medical history on file. Past Surgical History: No past surgical history on file.  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 She states that her low back started bothering her several months ago. She states she has been having Rt buttock pain several times a day and it gets to a point where she can not put any weight on her right leg.  She is unable to complete any of her housework without having pain.  She now occasionally has pain in her Left buttock as well.   How long can you sit comfortably?: Less than five minutes. How long can you stand comfortably?: Pt states she has immediate pain upon standing .She can stand for  ten minutes before she has to sit down.   How long can you walk comfortably?: Pt is able to walk less than five minutes. Patient Stated Goals: less pain Pain Assessment Currently in Pain?: Yes Pain Score: 8  (worst 20/10; best 5/10) Pain Location: Back Pain Type: Chronic pain Pain Radiating Towards: Rt buttock  Pain Onset: More than a month ago Pain Frequency: Constant Pain Relieving Factors: sitting Effect of Pain on Daily Activities: increases  Balance Screening Balance Screen Has the patient fallen in the past 6 months: Yes How many times?:  (1) Has the patient had a decrease in activity level because of a fear of falling? : No Is the patient reluctant to leave their home because of a fear of falling? : No  Prior Functioning  Prior Function Driving: Yes Leisure: Hobbies-yes  (Comment) Comments: yard work, sports     Sensation/Coordination/Flexibility/Functional Tests Functional Tests Functional Tests: foto 40  Assessment RLE Strength Right Hip Extension: 2+/5 Right Hip ABduction: 3/5 Right Knee Flexion: 3/5 Right Knee Extension: 3+/5 Right Ankle Dorsiflexion: 3/5 (no resistance given) LLE Strength Left Hip Extension: 2+/5 Left Hip ABduction: 3/5 Left Knee Flexion: 3/5 Left Knee Extension: 3+/5 Left Ankle Dorsiflexion: 3/5 (no resistance given) Lumbar AROM Lumbar Flexion: decreased 50% Lumbar Extension: decreased 20% reps  Lumbar - Right Side Bend: wnl reps  no change Lumbar - Left Side Bend: wnl reps no change Lumbar - Right Rotation: decreased 10% Lumbar - Left Rotation: decreased 10%   Exercise/Treatments     Stretches Active Hamstring Stretch: 2 reps;30 seconds Passive Hamstring Stretch: 2 reps;30 seconds Seated Other Seated Lumbar Exercises: [place], ab set and in and outs x 5         Physical Therapy Assessment and Plan PT Assessment and Plan Clinical Impression Statement: Pt is a 77 you female with hx of radicular back pain who has been referred to therapy to increase her core mm and improve her funtional ablitiy and body mechanics.  Pt exam reveals decreased ROM, decreased strength and significant sx response to all activity.  Pt will benefit from skilled PT to improve ROM, core strength and functional abiliyt to improve her quality of life.   Pt will benefit from skilled therapeutic intervention in order to improve  on the following deficits: Decreased activity tolerance;Difficulty walking;Pain;Decreased strength;Decreased range of motion Rehab Potential: Fair PT Frequency: Min 2X/week PT Duration: 4 weeks PT Treatment/Interventions: Therapeutic activities;Therapeutic exercise;Patient/family education PT Plan: Begin stretches and non weightbearing core strengtheing to progress to standing and body mechanics    Goals Home  Exercise Program Pt/caregiver will Perform Home Exercise Program: For increased strengthening PT Goal: Perform Home Exercise Program - Progress: Goal set today PT Short Term Goals Time to Complete Short Term Goals: 2 weeks PT Short Term Goal 1: Pt pain to be decreased to no greater than a 7/10  70% of the time to improve quality of life. PT Short Term Goal 2: Pt to be able to sit for 15 minutes without increased pain to eat a small meal PT Short Term Goal 3: Pt to be able to stand for 15 minutes to make a small meal PT Short Term Goal 4: Pt to be able to walk five minutes to get the mail PT Long Term Goals Time to Complete Long Term Goals: 4 weeks PT Long Term Goal 1: I in advance HEP PT Long Term Goal 2: Pt to be able to sit for 45 minutes to be able to travel Long Term Goal 3: Pt to be able to walk for 57min to be able to complete short shopping trips. Long Term Goal 4: Pt pain to be decreaesd to no greater than a 5/10 70% of the time.  Problem List Patient Active Problem List   Diagnosis Date Noted  . Radicular low back pain 03/21/2014  . Leg weakness, bilateral 03/21/2014  . Difficulty in walking(719.7) 03/21/2014  . Stiffness of joints, not elsewhere classified, multiple sites 03/21/2014    PT Plan of Care PT Home Exercise Plan: given  GP Functional Assessment Tool Used: foto Functional Limitation: Mobility: Walking and moving around Mobility: Walking and Moving Around Current Status (N0539): At least 60 percent but less than 80 percent impaired, limited or restricted Mobility: Walking and Moving Around Goal Status [phone]): At least 40 percent but less than 60 percent impaired, limited or restricted  Stephanie [name] 03/21/2014, 5:26 PM  Physician Documentation Your signature is required to indicate approval of the treatment plan as stated above.  Please sign and either send electronically or make a copy of this report for your files and return this physician signed  original.   Please mark one 1.__approve of plan  2. ___approve of plan with the following conditions.   ______________________________                                                          _____________________ Physician Signature                                                                                                             Date

## 2014-03-28 ENCOUNTER — Ambulatory Visit (HOSPITAL_COMMUNITY)
Admission: RE | Admit: 2014-03-28 | Discharge: 2014-03-28 | Disposition: A | Discharge status: Home / Self Care | Payer: Medicare Other | Source: Ambulatory Visit | Attending: Family Medicine | Admitting: Family Medicine

## 2014-03-28 DIAGNOSIS — IMO0001 Reserved for inherently not codable concepts without codable children: Secondary | ICD-10-CM | POA: Diagnosis not present

## 2014-03-28 DIAGNOSIS — R262 Difficulty in walking, not elsewhere classified: Secondary | ICD-10-CM | POA: Insufficient documentation

## 2014-03-28 DIAGNOSIS — M2569 Stiffness of other specified joint, not elsewhere classified: Secondary | ICD-10-CM | POA: Diagnosis not present

## 2014-03-28 DIAGNOSIS — M2560 Stiffness of unspecified joint, not elsewhere classified: Secondary | ICD-10-CM

## 2014-03-28 DIAGNOSIS — M545 Low back pain, unspecified: Secondary | ICD-10-CM | POA: Diagnosis not present

## 2014-03-28 DIAGNOSIS — M6281 Muscle weakness (generalized): Secondary | ICD-10-CM | POA: Diagnosis not present

## 2014-03-28 NOTE — Progress Notes (Signed)
Physical Therapy Treatment Patient Details  Name: Stephanie [name] MRN: [id number] Date of Birth: 1956/02/08  Today's Date: 03/28/2014 Time: 6761-9509 PT Time Calculation (min): 41 min Charges: Therex x 39'  Visit#: 2 of 8  Re-eval: 04/20/14  Authorization: medicare    Subjective: Symptoms/Limitations Symptoms: Pt reports HEP compliance. Pain Assessment Currently in Pain?: Yes Pain Score: 4  Pain Location: Back Pain Orientation: Lower  Exercise/Treatments Stretches Active Hamstring Stretch: 3 reps;20 seconds;Limitations Active Hamstring Stretch Limitations: standing 3-way Hip Flexor Stretch: 3 reps;20 seconds;Limitations Hip Flexor Stretch Limitations: standing 3-way Piriformis Stretch: 2 reps;30 seconds;Limitations Piriformis Stretch Limitations: figure four Standing Heel Raises: 10 reps;Limitations Heel Raises Limitations: Toe raises x 10 Functional Squats: 10 reps Supine Ab Set: 10 reps Bridge: 10 reps Straight Leg Raise: 10 reps  Physical Therapy Assessment and Plan PT Assessment and Plan Clinical Impression Statement: PTA facilitated therapeutic exercise to improve core strength, LE flexibility and decrease pain. Pt completes therex well after initial cueing and demo. Began hamstring, hip flexor and piriformis stretches to improve mobility and decrease pain. Pt reports pain decrease to 2/10 at end of session. Pt will benefit from skilled therapeutic intervention in order to improve on the following deficits: Decreased activity tolerance;Difficulty walking;Pain;Decreased strength;Decreased range of motion Rehab Potential: Fair PT Frequency: Min 2X/week PT Duration: 4 weeks PT Treatment/Interventions: Therapeutic activities;Therapeutic exercise;Patient/family education PT Plan: Continue to progress core/postural strength and stability and LE flexibility per PT POC.    Goals    Problem List Patient Active Problem List   Diagnosis Date Noted  . Radicular low back  pain 03/21/2014  . Leg weakness, bilateral 03/21/2014  . Difficulty in walking(719.7) 03/21/2014  . Stiffness of joints, not elsewhere classified, multiple sites 03/21/2014    PT - End of Session Activity Tolerance: Patient tolerated treatment well General Behavior During Therapy: [facility] for tasks assessed/performed  Rachelle Hora, PTA  03/28/2014, 3:58 PM

## 2014-03-30 ENCOUNTER — Telehealth (HOSPITAL_COMMUNITY): Payer: Self-pay

## 2014-03-30 ENCOUNTER — Ambulatory Visit (HOSPITAL_COMMUNITY): Payer: Medicare Other | Admitting: Physical Therapy

## 2014-04-04 ENCOUNTER — Ambulatory Visit (HOSPITAL_COMMUNITY)
Admission: RE | Admit: 2014-04-04 | Discharge: 2014-04-04 | Disposition: A | Discharge status: Home / Self Care | Payer: Medicare Other | Source: Ambulatory Visit | Attending: Family Medicine | Admitting: Family Medicine

## 2014-04-04 DIAGNOSIS — IMO0001 Reserved for inherently not codable concepts without codable children: Secondary | ICD-10-CM | POA: Diagnosis not present

## 2014-04-04 NOTE — Progress Notes (Signed)
Physical Therapy Treatment Patient Details  Name: JAALA BOHLE MRN: [id number] Date of Birth: [birth year]  Today's Date: 04/04/2014 Time: 0347-4259 PT Time Calculation (min): 43 min Charges: Therex x 40'  Visit#: 3 of 8  Re-eval: 04/20/14  Authorization: Medicare  Authorization Visit#: 3 of 10   Subjective: Symptoms/Limitations Symptoms: Pt reports radicular sx in right posterior hip all day yesterday. No pain currently. Pain Assessment Currently in Pain?: No/denies  Exercise/Treatments Stretches Active Hamstring Stretch: 3 reps;20 seconds;Limitations Active Hamstring Stretch Limitations: standing 3-way Hip Flexor Stretch: 3 reps;20 seconds;Limitations Hip Flexor Stretch Limitations: standing 3-way Piriformis Stretch: 2 reps;30 seconds;Limitations Piriformis Stretch Limitations: figure four Standing Heel Raises: 10 reps;Limitations Heel Raises Limitations: Toe raises x 10 Functional Squats: 10 reps Scapular Retraction: 10 reps;Theraband Theraband Level (Scapular Retraction): Level 3 (Green) Row: 10 reps;Theraband Theraband Level (Row): Level 3 (Green) Shoulder Extension: 10 reps;Theraband Theraband Level (Shoulder Extension): Level 3 (Green) Supine Ab Set: 10 reps Clam: 10 reps Bridge: 10 reps Straight Leg Raise: 10 reps Sidelying Hip Abduction: 10 reps Prone  Straight Leg Raise: 10 reps  Physical Therapy Assessment and Plan PT Assessment and Plan Clinical Impression Statement: Pt appears to be progressing well overall. Pt completes therex well after initial cueing and demo.  Pt requires multimodal cueing to improve form with scapular theraband exercises. Progressed to sidelying abduction and prone hip extension to improve hip strength/stability with minimal difficulty. Pt reports no increase in pain at end of session. Pt will benefit from skilled therapeutic intervention in order to improve on the following deficits: Decreased activity tolerance;Difficulty  walking;Pain;Decreased strength;Decreased range of motion Rehab Potential: Fair PT Frequency: Min 2X/week PT Duration: 4 weeks PT Treatment/Interventions: Therapeutic activities;Therapeutic exercise;Patient/family education PT Plan: Continue to progress core/postural strength and stability and LE flexibility per PT POC.    Problem List Patient Active Problem List   Diagnosis Date Noted  . Radicular low back pain 03/21/2014  . Leg weakness, bilateral 03/21/2014  . Difficulty in walking(719.7) 03/21/2014  . Stiffness of joints, not elsewhere classified, multiple sites 03/21/2014    PT - End of Session Activity Tolerance: Patient tolerated treatment well General Behavior During Therapy: [facility] for tasks assessed/performed  GP Functional Assessment Tool Used: foto  Rachelle Hora, PTA 04/04/2014, 3:24 PM

## 2014-04-06 ENCOUNTER — Ambulatory Visit (HOSPITAL_COMMUNITY)
Admission: RE | Admit: 2014-04-06 | Discharge: 2014-04-06 | Disposition: A | Discharge status: Home / Self Care | Payer: Medicare Other | Source: Ambulatory Visit | Attending: Family Medicine | Admitting: Family Medicine

## 2014-04-06 DIAGNOSIS — M5410 Radiculopathy, site unspecified: Secondary | ICD-10-CM

## 2014-04-06 DIAGNOSIS — M2560 Stiffness of unspecified joint, not elsewhere classified: Secondary | ICD-10-CM

## 2014-04-06 DIAGNOSIS — IMO0001 Reserved for inherently not codable concepts without codable children: Secondary | ICD-10-CM | POA: Diagnosis not present

## 2014-04-06 DIAGNOSIS — R29898 Other symptoms and signs involving the musculoskeletal system: Secondary | ICD-10-CM

## 2014-04-06 DIAGNOSIS — R262 Difficulty in walking, not elsewhere classified: Secondary | ICD-10-CM

## 2014-04-06 NOTE — Progress Notes (Signed)
Physical Therapy Treatment Patient Details  Name: Stephanie [name] MRN: [id number] Date of Birth: 23-May-1956  Today's Date: 04/06/2014 Time: 1025-8527 PT Time Calculation (min): 54 min  Visit#: 4 of 8  Re-eval: 04/20/14    Authorization: Medicare  Authorization Visit#: 4 of 8  Charge:  There ex [id number] Subjective: Symptoms/Limitations Symptoms: Pt states she had no idea how weak she wasl Pain Assessment Pain Score: 5  Pain Location: Back Pain Orientation: Lower     Exercise/Treatments    Stretches Active Hamstring Stretch: 3 reps;20 seconds;Limitations Single Knee to Chest Stretch: 3 reps;30 seconds Aerobic Stationary Bike: nustep L3 x 8'   Standing Forward Lunge: 5 reps Wall Slides: 10 reps Scapular Retraction: 10 reps;Theraband Theraband Level (Scapular Retraction): Level 3 (Green) Row: 10 reps;Theraband Theraband Level (Row): Level 3 (Green) Shoulder Extension: 10 reps;Theraband Theraband Level (Shoulder Extension): Level 3 (Green)   Supine Straight Leg Raise: 10 reps (floating) Isometric Hip Flexion: 10 reps Sidelying   Prone  Single Arm Raise: 10 reps Straight Leg Raise: 10 reps Opposite Arm/Leg Raise: 10 reps Other Prone Lumbar Exercises: B shoulder extension x 10      Physical Therapy Assessment and Plan PT Assessment and Plan Clinical Impression Statement: Added lunges. wall squat and prone ex for improved strength.  Pt needs verbal cuing and encouragement to keep core tight throughout exercises. PT Plan: begin side lunging next treatement.    Goals    Problem List Patient Active Problem List   Diagnosis Date Noted  . Radicular low back pain 03/21/2014  . Leg weakness, bilateral 03/21/2014  . Difficulty in walking(719.7) 03/21/2014  . Stiffness of joints, not elsewhere classified, multiple sites 03/21/2014    PT - End of Session Activity Tolerance: Patient tolerated treatment well General Behavior During Therapy: [facility] for tasks  assessed/performed  GP    Leeroy Cha 04/06/2014, 3:15 PM

## 2014-04-11 ENCOUNTER — Ambulatory Visit (HOSPITAL_COMMUNITY): Payer: Medicare Other | Admitting: Physical Therapy

## 2014-04-13 ENCOUNTER — Inpatient Hospital Stay (HOSPITAL_COMMUNITY): Admission: RE | Admit: 2014-04-13 | Payer: Medicare Other | Source: Ambulatory Visit | Admitting: Physical Therapy

## 2014-04-15 DIAGNOSIS — M47817 Spondylosis without myelopathy or radiculopathy, lumbosacral region: Secondary | ICD-10-CM | POA: Diagnosis not present

## 2014-04-15 DIAGNOSIS — Z6831 Body mass index (BMI) 31.0-31.9, adult: Secondary | ICD-10-CM | POA: Diagnosis not present

## 2014-04-15 DIAGNOSIS — M545 Low back pain, unspecified: Secondary | ICD-10-CM | POA: Diagnosis not present

## 2014-04-15 DIAGNOSIS — M5137 Other intervertebral disc degeneration, lumbosacral region: Secondary | ICD-10-CM | POA: Diagnosis not present

## 2014-05-11 DIAGNOSIS — Z6829 Body mass index (BMI) 29.0-29.9, adult: Secondary | ICD-10-CM | POA: Diagnosis not present

## 2014-05-11 DIAGNOSIS — G8929 Other chronic pain: Secondary | ICD-10-CM | POA: Diagnosis not present

## 2014-05-11 DIAGNOSIS — I10 Essential (primary) hypertension: Secondary | ICD-10-CM | POA: Diagnosis not present

## 2014-08-11 DIAGNOSIS — R079 Chest pain, unspecified: Secondary | ICD-10-CM | POA: Diagnosis not present

## 2014-08-11 DIAGNOSIS — G8929 Other chronic pain: Secondary | ICD-10-CM | POA: Diagnosis not present

## 2014-08-11 DIAGNOSIS — Z6830 Body mass index (BMI) 30.0-30.9, adult: Secondary | ICD-10-CM | POA: Diagnosis not present

## 2014-08-11 DIAGNOSIS — R413 Other amnesia: Secondary | ICD-10-CM | POA: Diagnosis not present

## 2014-09-07 DIAGNOSIS — M545 Low back pain: Secondary | ICD-10-CM | POA: Diagnosis not present

## 2014-09-07 DIAGNOSIS — M5136 Other intervertebral disc degeneration, lumbar region: Secondary | ICD-10-CM | POA: Diagnosis not present

## 2014-09-07 DIAGNOSIS — M542 Cervicalgia: Secondary | ICD-10-CM | POA: Diagnosis not present

## 2014-09-07 DIAGNOSIS — M47816 Spondylosis without myelopathy or radiculopathy, lumbar region: Secondary | ICD-10-CM | POA: Diagnosis not present

## 2014-09-14 DIAGNOSIS — I10 Essential (primary) hypertension: Secondary | ICD-10-CM | POA: Diagnosis not present

## 2014-09-14 DIAGNOSIS — M47816 Spondylosis without myelopathy or radiculopathy, lumbar region: Secondary | ICD-10-CM | POA: Diagnosis not present

## 2014-09-14 DIAGNOSIS — Z6831 Body mass index (BMI) 31.0-31.9, adult: Secondary | ICD-10-CM | POA: Diagnosis not present

## 2014-09-14 DIAGNOSIS — R52 Pain, unspecified: Secondary | ICD-10-CM | POA: Diagnosis not present

## 2014-09-14 DIAGNOSIS — M542 Cervicalgia: Secondary | ICD-10-CM | POA: Diagnosis not present

## 2014-09-14 DIAGNOSIS — M5136 Other intervertebral disc degeneration, lumbar region: Secondary | ICD-10-CM | POA: Diagnosis not present

## 2014-09-15 DIAGNOSIS — R9431 Abnormal electrocardiogram [ECG] [EKG]: Secondary | ICD-10-CM | POA: Diagnosis not present

## 2014-09-15 DIAGNOSIS — I450 Right fascicular block: Secondary | ICD-10-CM | POA: Diagnosis not present

## 2014-09-15 DIAGNOSIS — R079 Chest pain, unspecified: Secondary | ICD-10-CM | POA: Diagnosis not present

## 2014-09-15 DIAGNOSIS — R0600 Dyspnea, unspecified: Secondary | ICD-10-CM | POA: Diagnosis not present

## 2014-09-15 DIAGNOSIS — R0602 Shortness of breath: Secondary | ICD-10-CM | POA: Diagnosis not present

## 2014-09-15 DIAGNOSIS — I10 Essential (primary) hypertension: Secondary | ICD-10-CM | POA: Diagnosis not present

## 2014-09-19 ENCOUNTER — Encounter (HOSPITAL_COMMUNITY): Payer: Self-pay | Admitting: Emergency Medicine

## 2014-09-19 ENCOUNTER — Observation Stay (HOSPITAL_COMMUNITY)
Admission: EM | Admit: 2014-09-19 | Discharge: 2014-09-20 | Disposition: A | Discharge status: Home / Self Care | Payer: Medicare Other | Attending: Internal Medicine | Admitting: Internal Medicine

## 2014-09-19 ENCOUNTER — Emergency Department (HOSPITAL_COMMUNITY): Payer: Medicare Other

## 2014-09-19 DIAGNOSIS — Z23 Encounter for immunization: Secondary | ICD-10-CM | POA: Insufficient documentation

## 2014-09-19 DIAGNOSIS — E785 Hyperlipidemia, unspecified: Secondary | ICD-10-CM | POA: Diagnosis present

## 2014-09-19 DIAGNOSIS — F329 Major depressive disorder, single episode, unspecified: Secondary | ICD-10-CM | POA: Diagnosis not present

## 2014-09-19 DIAGNOSIS — I10 Essential (primary) hypertension: Secondary | ICD-10-CM | POA: Diagnosis present

## 2014-09-19 DIAGNOSIS — E780 Pure hypercholesterolemia: Secondary | ICD-10-CM | POA: Diagnosis not present

## 2014-09-19 DIAGNOSIS — Z7982 Long term (current) use of aspirin: Secondary | ICD-10-CM | POA: Insufficient documentation

## 2014-09-19 DIAGNOSIS — R61 Generalized hyperhidrosis: Secondary | ICD-10-CM | POA: Insufficient documentation

## 2014-09-19 DIAGNOSIS — R110 Nausea: Secondary | ICD-10-CM | POA: Insufficient documentation

## 2014-09-19 DIAGNOSIS — E669 Obesity, unspecified: Secondary | ICD-10-CM | POA: Diagnosis present

## 2014-09-19 DIAGNOSIS — Z79899 Other long term (current) drug therapy: Secondary | ICD-10-CM | POA: Diagnosis not present

## 2014-09-19 DIAGNOSIS — T1490XA Injury, unspecified, initial encounter: Secondary | ICD-10-CM

## 2014-09-19 DIAGNOSIS — E782 Mixed hyperlipidemia: Secondary | ICD-10-CM | POA: Diagnosis not present

## 2014-09-19 DIAGNOSIS — R079 Chest pain, unspecified: Secondary | ICD-10-CM | POA: Diagnosis not present

## 2014-09-19 DIAGNOSIS — F419 Anxiety disorder, unspecified: Secondary | ICD-10-CM | POA: Diagnosis not present

## 2014-09-19 DIAGNOSIS — R9431 Abnormal electrocardiogram [ECG] [EKG]: Secondary | ICD-10-CM | POA: Diagnosis not present

## 2014-09-19 DIAGNOSIS — K219 Gastro-esophageal reflux disease without esophagitis: Secondary | ICD-10-CM | POA: Diagnosis not present

## 2014-09-19 DIAGNOSIS — R0789 Other chest pain: Secondary | ICD-10-CM | POA: Diagnosis not present

## 2014-09-19 DIAGNOSIS — Z6830 Body mass index (BMI) 30.0-30.9, adult: Secondary | ICD-10-CM | POA: Diagnosis not present

## 2014-09-19 HISTORY — DX: Pure hypercholesterolemia, unspecified: E78.00

## 2014-09-19 HISTORY — DX: Anxiety disorder, unspecified: F41.9

## 2014-09-19 HISTORY — DX: Essential (primary) hypertension: I10

## 2014-09-19 HISTORY — DX: Gastro-esophageal reflux disease without esophagitis: K21.9

## 2014-09-19 HISTORY — DX: Depression, unspecified: F32.A

## 2014-09-19 HISTORY — DX: Major depressive disorder, single episode, unspecified: F32.9

## 2014-09-19 LAB — CBC WITH DIFFERENTIAL/PLATELET
BASOS PCT: 0 % (ref 0–1)
Basophils Absolute: 0 10*3/uL (ref 0.0–0.1)
Eosinophils Absolute: 0.1 10*3/uL (ref 0.0–0.7)
Eosinophils Relative: 1 % (ref 0–5)
HEMATOCRIT: 38.3 % (ref 36.0–46.0)
HEMOGLOBIN: 12.6 g/dL (ref 12.0–15.0)
LYMPHS PCT: 33 % (ref 12–46)
Lymphs Abs: 2.4 10*3/uL (ref 0.7–4.0)
MCH: 28.6 pg (ref 26.0–34.0)
MCHC: 32.9 g/dL (ref 30.0–36.0)
MCV: 86.8 fL (ref 78.0–100.0)
MONO ABS: 0.5 10*3/uL (ref 0.1–1.0)
MONOS PCT: 7 % (ref 3–12)
NEUTROS ABS: 4.3 10*3/uL (ref 1.7–7.7)
Neutrophils Relative %: 59 % (ref 43–77)
Platelets: 265 10*3/uL (ref 150–400)
RBC: 4.41 MIL/uL (ref 3.87–5.11)
RDW: 14.7 % (ref 11.5–15.5)
WBC: 7.3 10*3/uL (ref 4.0–10.5)

## 2014-09-19 LAB — COMPREHENSIVE METABOLIC PANEL
ALT: 23 U/L (ref 0–35)
ANION GAP: 13 (ref 5–15)
AST: 17 U/L (ref 0–37)
Albumin: 4.1 g/dL (ref 3.5–5.2)
Alkaline Phosphatase: 97 U/L (ref 39–117)
BILIRUBIN TOTAL: 0.2 mg/dL — AB (ref 0.3–1.2)
BUN: 14 mg/dL (ref 6–23)
CHLORIDE: 97 meq/L (ref 96–112)
CO2: 29 meq/L (ref 19–32)
CREATININE: 1.14 mg/dL — AB (ref 0.50–1.10)
Calcium: 9.7 mg/dL (ref 8.4–10.5)
GFR, EST AFRICAN AMERICAN: 61 mL/min — AB (ref >90)
GFR, EST NON AFRICAN AMERICAN: 52 mL/min — AB (ref >90)
GLUCOSE: 105 mg/dL — AB (ref 70–99)
Potassium: 3.8 mEq/L (ref 3.7–5.3)
Sodium: 139 mEq/L (ref 137–147)
Total Protein: 7.9 g/dL (ref 6.0–8.3)

## 2014-09-19 LAB — TROPONIN I
Troponin I: <0.3 ng/mL (ref <0.30)
Troponin I: <0.3 ng/mL (ref <0.30)

## 2014-09-19 MED ORDER — LISINOPRIL 10 MG PO TABS
10.0000 mg | ORAL_TABLET | Freq: Every evening | ORAL | Status: DC
  Administered 2014-09-19: 10 mg via ORAL
  Filled 2014-09-19: qty 1

## 2014-09-19 MED ORDER — INFLUENZA VAC SPLIT QUAD 0.5 ML IM SUSY
0.5000 mL | PREFILLED_SYRINGE | INTRAMUSCULAR | Status: AC
  Administered 2014-09-20: 0.5 mL via INTRAMUSCULAR
  Filled 2014-09-19: qty 0.5

## 2014-09-19 MED ORDER — HYDROCHLOROTHIAZIDE 25 MG PO TABS
25.0000 mg | ORAL_TABLET | Freq: Every day | ORAL | Status: DC
  Administered 2014-09-20: 25 mg via ORAL
  Filled 2014-09-19 (×2): qty 1

## 2014-09-19 MED ORDER — ONDANSETRON HCL 4 MG PO TABS: 4.0000 mg | ORAL_TABLET | Freq: Four times a day (QID) | ORAL | Status: DC | PRN

## 2014-09-19 MED ORDER — OXYCODONE-ACETAMINOPHEN 5-325 MG PO TABS
1.0000 | ORAL_TABLET | ORAL | Status: DC | PRN
  Administered 2014-09-20: 1 via ORAL
  Filled 2014-09-19: qty 1

## 2014-09-19 MED ORDER — CYCLOBENZAPRINE HCL 10 MG PO TABS
10.0000 mg | ORAL_TABLET | Freq: Two times a day (BID) | ORAL | Status: DC
  Administered 2014-09-19 – 2014-09-20 (×2): 10 mg via ORAL
  Filled 2014-09-19 (×2): qty 1

## 2014-09-19 MED ORDER — PANTOPRAZOLE SODIUM 40 MG PO TBEC
40.0000 mg | DELAYED_RELEASE_TABLET | Freq: Every day | ORAL | Status: DC
  Administered 2014-09-20: 40 mg via ORAL
  Filled 2014-09-19 (×2): qty 1

## 2014-09-19 MED ORDER — SIMVASTATIN 10 MG PO TABS
10.0000 mg | ORAL_TABLET | Freq: Every day | ORAL | Status: DC
  Administered 2014-09-19 – 2014-09-20 (×2): 10 mg via ORAL
  Filled 2014-09-19 (×2): qty 1

## 2014-09-19 MED ORDER — ONDANSETRON HCL 4 MG/2ML IJ SOLN: 4.0000 mg | Freq: Four times a day (QID) | INTRAMUSCULAR | Status: DC | PRN

## 2014-09-19 MED ORDER — ASPIRIN EC 81 MG PO TBEC
81.0000 mg | DELAYED_RELEASE_TABLET | Freq: Every day | ORAL | Status: DC
  Administered 2014-09-20: 81 mg via ORAL
  Filled 2014-09-19 (×3): qty 1

## 2014-09-19 MED ORDER — DIAZEPAM 5 MG PO TABS
10.0000 mg | ORAL_TABLET | Freq: Three times a day (TID) | ORAL | Status: DC | PRN
  Administered 2014-09-19: 10 mg via ORAL
  Filled 2014-09-19: qty 2

## 2014-09-19 MED ORDER — HEPARIN SODIUM (PORCINE) 5000 UNIT/ML IJ SOLN
5000.0000 [IU] | Freq: Three times a day (TID) | INTRAMUSCULAR | Status: DC
  Administered 2014-09-19 – 2014-09-20 (×3): 5000 [IU] via SUBCUTANEOUS
  Filled 2014-09-19 (×3): qty 1

## 2014-09-19 MED ORDER — GABAPENTIN 100 MG PO CAPS
100.0000 mg | ORAL_CAPSULE | Freq: Two times a day (BID) | ORAL | Status: DC
Start: 2014-09-19 — End: 2014-09-20
  Administered 2014-09-19 – 2014-09-20 (×2): 100 mg via ORAL
  Filled 2014-09-19 (×2): qty 1

## 2014-09-19 MED ORDER — MIRTAZAPINE 30 MG PO TABS
15.0000 mg | ORAL_TABLET | Freq: Every day | ORAL | Status: DC
  Administered 2014-09-19: 15 mg via ORAL
  Filled 2014-09-19: qty 1

## 2014-09-19 MED ORDER — SODIUM CHLORIDE 0.9 % IJ SOLN
3.0000 mL | Freq: Two times a day (BID) | INTRAMUSCULAR | Status: DC
  Administered 2014-09-19 – 2014-09-20 (×2): 3 mL via INTRAVENOUS

## 2014-09-19 NOTE — H&P (Signed)
[facility] History and Physical  Stephanie [name] TDV:[id number] DOB: Jul 11, 1956 DOA: 09/19/2014  Referring physician: ER PCP: Leonides Grills, MD   Chief Complaint: Chest pain  HPI: Stephanie [name] is a 58 y.o. female  This is a 58 year old lady, who has hypertension, hyperlipidemia and is obese and who now presents with chest pain which she is actually had intermittently for the last 3 months or so. She has seen previously primary care physician for this and apparently did see a cardiologist recently but the details of this are not entirely clear. She went to see her primary care physician today and she had an appointment and with the history and abnormal electrocardiogram, she was sent to the emergency room. She did not actually have any chest pain today. She describes the chest pain as quite severe, dull and can come on at rest. It does not seem to radiate anywhere. It does not seem to particularly come on with exertion. She is a nonsmoker. She is now being admitted for further evaluation.   Review of Systems:  Constitutional:  No weight loss, night sweats, Fevers, chills, fatigue.  HEENT:  No headaches, Difficulty swallowing,Tooth/dental problems,Sore throat,  No sneezing, itching, ear ache, nasal congestion, post nasal drip,   GI:  No heartburn, indigestion, abdominal pain, nausea, vomiting, diarrhea, change in bowel habits, loss of appetite  Resp:  No shortness of breath with exertion or at rest. No excess mucus, no productive cough, No non-productive cough, No coughing up of blood.No change in color of mucus.No wheezing.No chest wall deformity  Skin:  no rash or lesions.  GU:  no dysuria, change in color of urine, no urgency or frequency. No flank pain.  Musculoskeletal:  No joint pain or swelling. No decreased range of motion. No back pain.  Psych:  No change in mood or affect. No depression or anxiety. No memory loss.   Past Medical History  Diagnosis Date  .  Depression   . Anxiety    Past Surgical History  Procedure Laterality Date  . Hernia repair    . Neck surgery    . Abdominal hysterectomy    . Cholecystectomy     Social History:  reports that she has never smoked. She does not have any smokeless tobacco history on file. She reports that she does not drink alcohol or use illicit drugs.  Allergies  Allergen Reactions  . Prednisone Anaphylaxis    History reviewed. No pertinent family history.   Prior to Admission medications   Medication Sig Start Date End Date Taking? Authorizing Provider  aspirin 81 MG EC tablet Take 81 mg by mouth daily.   Yes Historical Provider, MD  dexlansoprazole (DEXILANT) 60 MG capsule Take 60 mg by mouth 2 (two) times daily.   Yes Historical Provider, MD  diazepam (VALIUM) 10 MG tablet Take 10 mg by mouth 3 (three) times daily as needed for anxiety.    Yes Historical Provider, MD  gabapentin (NEURONTIN) 100 MG capsule Take 100 mg by mouth 2 (two) times daily.   Yes Historical Provider, MD  hydrochlorothiazide (HYDRODIURIL) 25 MG tablet Take 25 mg by mouth daily.   Yes Historical Provider, MD  lisinopril (PRINIVIL,ZESTRIL) 10 MG tablet Take 10 mg by mouth every evening.   Yes Historical Provider, MD  mirtazapine (REMERON) 15 MG tablet Take 15 mg by mouth at bedtime.   Yes Historical Provider, MD  pregabalin (LYRICA) 50 MG capsule Take 50 mg by mouth 3 (three) times daily as needed.  Yes Historical Provider, MD  simvastatin (ZOCOR) 10 MG tablet Take 10 mg by mouth daily.   Yes Historical Provider, MD  tapentadol (NUCYNTA) 50 MG TABS tablet Take 1 tablet by mouth every 8 (eight) hours as needed for moderate pain.    Yes Historical Provider, MD  buPROPion [facility]) 100 MG tablet  09/19/14   Historical Provider, MD  cyclobenzaprine (FLEXERIL) 10 MG tablet Take 1 tablet by mouth 2 (two) times daily. 09/14/14   Historical Provider, MD  oxyCODONE-acetaminophen (PERCOCET/ROXICET) 5-325 MG per tablet  07/22/14    Historical Provider, MD   Physical Exam: Filed Vitals:   09/19/14 1332  BP: 127/86  Pulse: 100  Temp: 98.2 F (36.8 C)  TempSrc: Oral  Resp: 16  Height: 5\' 2"  (1.575 m)  Weight: 76.204 kg (168 lb)  SpO2: 100%    Wt Readings from Last 3 Encounters:  09/19/14 76.204 kg (168 lb)    General:  Appears calm and comfortable. Currently not in pain. Eyes: PERRL, normal lids, irises & conjunctiva ENT: grossly normal hearing, lips & tongue Neck: no LAD, masses or thyromegaly Cardiovascular: RRR, no m/r/g. No LE edema. Telemetry: SR, no arrhythmias  Respiratory: CTA bilaterally, no w/r/r. Normal respiratory effort. Abdomen: soft, ntnd Skin: no rash or induration seen on limited exam Musculoskeletal: Tender in the anterior chest wall, somewhat reproducing her pain. Psychiatric: grossly normal mood and affect, speech fluent and appropriate Neurologic: grossly non-focal.          Labs on Admission:  Basic Metabolic Panel:  Recent Labs Lab 09/19/14 1357  NA 139  K 3.8  CL 97  CO2 29  GLUCOSE 105*  BUN 14  CREATININE 1.14*  CALCIUM 9.7   Liver Function Tests:  Recent Labs Lab 09/19/14 1357  AST 17  ALT 23  ALKPHOS 97  BILITOT 0.2*  PROT 7.9  ALBUMIN 4.1   No results found for this basename: LIPASE, AMYLASE,  in the last 168 hours No results found for this basename: AMMONIA,  in the last 168 hours CBC:  Recent Labs Lab 09/19/14 1357  WBC 7.3  NEUTROABS 4.3  HGB 12.6  HCT 38.3  MCV 86.8  PLT 265   Cardiac Enzymes:  Recent Labs Lab 09/19/14 1357  TROPONINI <0.30    BNP (last 3 results) No results found for this basename: PROBNP,  in the last 8760 hours CBG: No results found for this basename: GLUCAP,  in the last 168 hours  Radiological Exams on Admission: Dg Chest 2 View  09/19/2014   CLINICAL DATA:  Chronic Chest pain getting worse over the past few days.  EXAM: CHEST  2 VIEW  COMPARISON:  01/28/2008  FINDINGS: The heart size and mediastinal  contours are within normal limits. Both lungs are clear. The visualized skeletal structures are unremarkable.  IMPRESSION: No acute cardiopulmonary findings.   Electronically Signed   By: Kalman Jewels M.D.   On: 09/19/2014 14:38    EKG: Independently reviewed. Normal sinus rhythm with nonspecific ST-T wave changes. No acute ST elevation.  Assessment/Plan   1. Chest pain, atypical, intermittent for the last 3 months. Patient has risk factors for coronary artery disease. 2. Hypertension. 3. Hyperlipidemia 4. Obesity. 5. Chronic low back pain, on opioids chronically.  Plan: 1. Admit to time a tree. 2. Serial cardiac enzymes. 3. Cardiology consultation. I suspect she may need a stress test.  Further recommendations will depend on patient's hospital progress.   Code Status: Full code.  DVT Prophylaxis: Heparin.  Family Communication: I discussed the plan with the patient at the bedside.   Disposition Plan: Home when medically stable.   Time spent: 60 minutes.  Doree Albee [facility] Pager [phone].

## 2014-09-19 NOTE — ED Provider Notes (Signed)
CSN: [id number]     Arrival date & time 09/19/14  1307 History  This chart was scribed for [employer]. Alvino Chapel, MD by Rayfield Citizen, ED Scribe. This patient was seen in room APA19/APA19 and the patient's care was started at 1:49 PM.    Chief Complaint  Patient presents with  . Chest Pain   The history is provided by the patient. No language interpreter was used.    HPI Comments: Stephanie [name] is a 58 y.o. female who presents to the Emergency Department complaining of intermittent chest pain since July. Patient explains she was in the car on a trip when she had a sudden onset chest pain; she pulled over and took an aspirin, with relief. She reports associated pressure and heaviness at that time. She had a similar pain two weeks after that original incident, at which time she also noted jaw pain. She reports that she saw a neurosurgeon at that time - he works to manage her chronic back pain after a series of surgeries - and he stated that her EKG was "fine."   She has had approx. 4 episodes of chest pain since the onset of this issue; she states that there is no way to purposefully initiate her symptoms or relieve them. She reports diaphoresis and nausea with her symptoms as well as swelling in her lower extremities. She also reports headaches and occasional confusion. She denies vomiting. She has not yet had a stress test.   She reports that she is around cigarette smoke but is not a smoker. She has a history of GERD.   Past Medical History  Diagnosis Date  . Depression   . Anxiety   . Hypertension   . Hypercholesterolemia   . GERD (gastroesophageal reflux disease)    Past Surgical History  Procedure Laterality Date  . Hernia repair    . Neck surgery    . Abdominal hysterectomy    . Cholecystectomy    . Appendectomy     Family History  Problem Relation Age of Onset  . Parkinson's disease Father     Deceased  . Brain cancer Mother     Deceased   History  Substance Use Topics  .  Smoking status: Never Smoker   . Smokeless tobacco: Not on file  . Alcohol Use: No   OB History   Grav Para Term Preterm Abortions TAB SAB Ect Mult Living                 Review of Systems  Constitutional: Positive for diaphoresis.  Cardiovascular: Positive for chest pain.  Gastrointestinal: Positive for nausea.  All other systems reviewed and are negative.   Allergies  Prednisone  Home Medications   Prior to Admission medications   Medication Sig Start Date End Date Taking? Authorizing Provider  aspirin 81 MG EC tablet Take 81 mg by mouth daily.   Yes Historical Provider, MD  buPROPion (WELLBUTRIN) 100 MG tablet Take 200 mg by mouth daily.  09/19/14  Yes Historical Provider, MD  cyclobenzaprine (FLEXERIL) 10 MG tablet Take 1 tablet by mouth 2 (two) times daily. 09/14/14  Yes Historical Provider, MD  dexlansoprazole (DEXILANT) 60 MG capsule Take 60 mg by mouth 2 (two) times daily.   Yes Historical Provider, MD  diazepam (VALIUM) 10 MG tablet Take 10 mg by mouth 3 (three) times daily as needed for anxiety.    Yes Historical Provider, MD  gabapentin (NEURONTIN) 100 MG capsule Take 100 mg by mouth 2 (two)  times daily.   Yes Historical Provider, MD  hydrochlorothiazide (HYDRODIURIL) 25 MG tablet Take 25 mg by mouth daily.   Yes Historical Provider, MD  lisinopril (PRINIVIL,ZESTRIL) 10 MG tablet Take 10 mg by mouth every evening.   Yes Historical Provider, MD  mirtazapine (REMERON) 15 MG tablet Take 15 mg by mouth at bedtime.   Yes Historical Provider, MD  oxyCODONE-acetaminophen (PERCOCET/ROXICET) 5-325 MG per tablet  07/22/14  Yes Historical Provider, MD  pregabalin (LYRICA) 50 MG capsule Take 50 mg by mouth 3 (three) times daily as needed.   Yes Historical Provider, MD  simvastatin (ZOCOR) 10 MG tablet Take 10 mg by mouth daily.   Yes Historical Provider, MD  tapentadol (NUCYNTA) 50 MG TABS tablet Take 1 tablet by mouth every 8 (eight) hours as needed for moderate pain.    Yes  Historical Provider, MD   BP 110/71  Pulse 112  Temp(Src) 98.1 F (36.7 C) (Oral)  Resp 18  Ht 5\' 2"  (1.575 m)  Wt 160 lb 8 oz (72.802 kg)  BMI 29.35 kg/m2  SpO2 95% Physical Exam  Nursing note and vitals reviewed. Constitutional: She is oriented to person, place, and time. She appears well-developed and well-nourished.  HENT:  Head: Normocephalic and atraumatic.  Neck: No tracheal deviation present.  Cardiovascular: Normal rate, regular rhythm and normal heart sounds.  Exam reveals no gallop and no friction rub.   No murmur heard. Pulmonary/Chest: Effort normal.  Musculoskeletal:  Trace bilateral lower extremity edema  Neurological: She is alert and oriented to person, place, and time.  Skin: Skin is warm and dry.  Psychiatric: She has a normal mood and affect. Her behavior is normal.    ED Course  Procedures   DIAGNOSTIC STUDIES: Oxygen Saturation is 100% on RA, normal by my interpretation.    COORDINATION OF CARE: 1:55 PM Discussed treatment plan with pt at bedside and pt agreed to plan.   Labs Review Labs Reviewed  COMPREHENSIVE METABOLIC PANEL - Abnormal; Notable for the following:    Glucose, Bld 105 (*)    Creatinine, Ser 1.14 (*)    Total Bilirubin 0.2 (*)    GFR calc non Af Amer 52 (*)    GFR calc Af Amer 61 (*)    All other components within normal limits  COMPREHENSIVE METABOLIC PANEL - Abnormal; Notable for the following:    Glucose, Bld 114 (*)    Total Bilirubin 0.2 (*)    GFR calc non Af Amer 57 (*)    GFR calc Af Amer 65 (*)    All other components within normal limits  CBC WITH DIFFERENTIAL  TROPONIN I  TSH  TROPONIN I  TROPONIN I  TROPONIN I    Imaging Review Dg Chest 2 View  09/19/2014   CLINICAL DATA:  Chronic Chest pain getting worse over the past few days.  EXAM: CHEST  2 VIEW  COMPARISON:  01/28/2008  FINDINGS: The heart size and mediastinal contours are within normal limits. Both lungs are clear. The visualized skeletal structures  are unremarkable.  IMPRESSION: No acute cardiopulmonary findings.   Electronically Signed   By: Kalman Jewels M.D.   On: 09/19/2014 14:38     EKG Interpretation   Date/Time:  Monday September 19 2014 13:54:20 EDT Ventricular Rate:  101 PR Interval:  147 QRS Duration: 95 QT Interval:  374 QTC Calculation: 485 R Axis:   56 Text Interpretation:  Sinus tachycardia RSR' in V1 or V2, right VCD or RVH  Borderline  T abnormalities, anterior leads Nonspecific T wave abnormality  Confirmed by Alvino Chapel  MD, Acelin Ferdig [phone]) on 09/19/2014 3:07:20 PM      MDM   Final diagnoses:  Chest pain   Patient with episodes of chest pain. Sent in for admission. Nonspecific T-wave changes but unknown baseline. Will admit to internal medicine     Jasper Riling. Alvino Chapel, MD 09/20/14 2225

## 2014-09-19 NOTE — ED Notes (Signed)
Pt being evaluated by hospitalist 

## 2014-09-19 NOTE — ED Notes (Signed)
Chest pain since July ,  Seen by cardiologist  In [place] and  Told that EKG was all right.    Sent Here by Dr Gerarda Fraction for eval of chest pain

## 2014-09-20 ENCOUNTER — Encounter (HOSPITAL_COMMUNITY): Payer: Self-pay | Admitting: Adult Health

## 2014-09-20 DIAGNOSIS — I10 Essential (primary) hypertension: Secondary | ICD-10-CM | POA: Diagnosis not present

## 2014-09-20 DIAGNOSIS — I519 Heart disease, unspecified: Secondary | ICD-10-CM

## 2014-09-20 DIAGNOSIS — R072 Precordial pain: Secondary | ICD-10-CM | POA: Diagnosis not present

## 2014-09-20 DIAGNOSIS — E669 Obesity, unspecified: Secondary | ICD-10-CM | POA: Diagnosis not present

## 2014-09-20 DIAGNOSIS — R079 Chest pain, unspecified: Secondary | ICD-10-CM

## 2014-09-20 DIAGNOSIS — E785 Hyperlipidemia, unspecified: Secondary | ICD-10-CM | POA: Diagnosis not present

## 2014-09-20 LAB — COMPREHENSIVE METABOLIC PANEL
ALT: 22 U/L (ref 0–35)
ANION GAP: 14 (ref 5–15)
AST: 16 U/L (ref 0–37)
Albumin: 3.8 g/dL (ref 3.5–5.2)
Alkaline Phosphatase: 95 U/L (ref 39–117)
BILIRUBIN TOTAL: 0.2 mg/dL — AB (ref 0.3–1.2)
BUN: 13 mg/dL (ref 6–23)
CHLORIDE: 99 meq/L (ref 96–112)
CO2: 28 mEq/L (ref 19–32)
Calcium: 9.4 mg/dL (ref 8.4–10.5)
Creatinine, Ser: 1.07 mg/dL (ref 0.50–1.10)
GFR calc Af Amer: 65 mL/min — ABNORMAL LOW (ref >90)
GFR calc non Af Amer: 57 mL/min — ABNORMAL LOW (ref >90)
Glucose, Bld: 114 mg/dL — ABNORMAL HIGH (ref 70–99)
Potassium: 3.7 mEq/L (ref 3.7–5.3)
Sodium: 141 mEq/L (ref 137–147)
Total Protein: 7.5 g/dL (ref 6.0–8.3)

## 2014-09-20 LAB — TROPONIN I

## 2014-09-20 LAB — TSH: TSH: 2.93 u[IU]/mL (ref 0.350–4.500)

## 2014-09-20 NOTE — Progress Notes (Signed)
UR completed 

## 2014-09-20 NOTE — Discharge Summary (Signed)
Physician Discharge Summary  Stephanie [name] MPN:[id number] DOB: 1956/11/09 DOA: 09/19/2014  PCP: Leonides Grills, MD  Admit date: 09/19/2014 Discharge date: 09/20/2014  Time spent: 35 minutes  Recommendations for Outpatient Follow-up:  1. Patient will follow up with cardiology as an outpatient for stress testing  Discharge Diagnoses:  Active Problems:   Chest pain   HTN (hypertension)   Hyperlipidemia   Obesity   Discharge Condition: improved  Diet recommendation: low salt  Filed Weights   09/19/14 1332 09/19/14 1641  Weight: 76.204 kg (168 lb) 72.802 kg (160 lb 8 oz)    History of present illness:  This 58 year old female with history of hypertension and hyperlipidemia as well as obesity who presents to the emergency room with complaints of chest pain which have been present intermittently for approximately 3 months. She went to her primary care physician and son have an abnormal EKG therefore was sent to the emergency room. Review of EKG indicated that she had a right bundle branch block.  Hospital Course:  She was monitored in the hospital and ruled out for ACS with negative cardiac markers and a nonacute EKG. She was seen by cardiology who ordered echocardiogram which was also unremarkable. It was felt appropriate to have the patient follow-up as an outpatient for further stress testing. This option was present for the patient was agreeable to follow-up. She'll be continued on her regular medications including aspirin. She is otherwise stable for discharge.  Procedures: Echo: Left ventricle: The cavity size was normal. Wall thickness was normal. Systolic function was normal. The estimated ejection fraction was in the range of 60% to 65%. Wall motion was normal; there were no regional wall motion abnormalities. Doppler parameters are consistent with abnormal left ventricular relaxation (grade 1 diastolic dysfunction).    Consultations:  Cardiology  Discharge  Exam: Filed Vitals:   09/20/14 0720  BP: 97/58  Pulse: 91  Temp: 97.6 F (36.4 C)  Resp: 20    General: NAD Cardiovascular: S1, s2, rrr Respiratory: CTA B  Discharge Instructions You were cared for by a hospitalist during your hospital stay. If you have any questions about your discharge medications or the care you received while you were in the hospital after you are discharged, you can call the unit and asked to speak with the hospitalist on call if the hospitalist that took care of you is not available. Once you are discharged, your primary care physician will handle any further medical issues. Please note that NO REFILLS for any discharge medications will be authorized once you are discharged, as it is imperative that you return to your primary care physician (or establish a relationship with a primary care physician if you do not have one) for your aftercare needs so that they can reassess your need for medications and monitor your lab values.  Discharge Instructions   Call MD for:  difficulty breathing, headache or visual disturbances    Complete by:  As directed      Call MD for:  persistant nausea and vomiting    Complete by:  As directed      Call MD for:  severe uncontrolled pain    Complete by:  As directed      Diet - low sodium heart healthy    Complete by:  As directed      Increase activity slowly    Complete by:  As directed           Current Discharge Medication List  CONTINUE these medications which have NOT CHANGED   Details  aspirin 81 MG EC tablet Take 81 mg by mouth daily.    buPROPion (WELLBUTRIN) 100 MG tablet Take 200 mg by mouth daily.     cyclobenzaprine (FLEXERIL) 10 MG tablet Take 1 tablet by mouth 2 (two) times daily.    dexlansoprazole (DEXILANT) 60 MG capsule Take 60 mg by mouth 2 (two) times daily.    diazepam (VALIUM) 10 MG tablet Take 10 mg by mouth 3 (three) times daily as needed for anxiety.     gabapentin (NEURONTIN) 100 MG capsule  Take 100 mg by mouth 2 (two) times daily.    hydrochlorothiazide (HYDRODIURIL) 25 MG tablet Take 25 mg by mouth daily.    lisinopril (PRINIVIL,ZESTRIL) 10 MG tablet Take 10 mg by mouth every evening.    mirtazapine (REMERON) 15 MG tablet Take 15 mg by mouth at bedtime.    oxyCODONE-acetaminophen (PERCOCET/ROXICET) 5-325 MG per tablet     pregabalin (LYRICA) 50 MG capsule Take 50 mg by mouth 3 (three) times daily as needed.    simvastatin (ZOCOR) 10 MG tablet Take 10 mg by mouth daily.    tapentadol (NUCYNTA) 50 MG TABS tablet Take 1 tablet by mouth every 8 (eight) hours as needed for moderate pain.        Allergies  Allergen Reactions  . Prednisone Anaphylaxis      The results of significant diagnostics from this hospitalization (including imaging, microbiology, ancillary and laboratory) are listed below for reference.    Significant Diagnostic Studies: Dg Chest 2 View  09/19/2014   CLINICAL DATA:  Chronic Chest pain getting worse over the past few days.  EXAM: CHEST  2 VIEW  COMPARISON:  01/28/2008  FINDINGS: The heart size and mediastinal contours are within normal limits. Both lungs are clear. The visualized skeletal structures are unremarkable.  IMPRESSION: No acute cardiopulmonary findings.   Electronically Signed   By: Kalman Jewels M.D.   On: 09/19/2014 14:38    Microbiology: No results found for this or any previous visit (from the past 240 hour(s)).   Labs: Basic Metabolic Panel:  Recent Labs Lab 09/19/14 1357 09/20/14 0326  NA 139 141  K 3.8 3.7  CL 97 99  CO2 29 28  GLUCOSE 105* 114*  BUN 14 13  CREATININE 1.14* 1.07  CALCIUM 9.7 9.4   Liver Function Tests:  Recent Labs Lab 09/19/14 1357 09/20/14 0326  AST 17 16  ALT 23 22  ALKPHOS 97 95  BILITOT 0.2* 0.2*  PROT 7.9 7.5  ALBUMIN 4.1 3.8   No results found for this basename: LIPASE, AMYLASE,  in the last 168 hours No results found for this basename: AMMONIA,  in the last 168  hours CBC:  Recent Labs Lab 09/19/14 1357  WBC 7.3  NEUTROABS 4.3  HGB 12.6  HCT 38.3  MCV 86.8  PLT 265   Cardiac Enzymes:  Recent Labs Lab 09/19/14 1357 09/19/14 1603 09/19/14 2142 09/20/14 0326  TROPONINI <0.30 <0.30 <0.30 <0.30   BNP: BNP (last 3 results) No results found for this basename: PROBNP,  in the last 8760 hours CBG: No results found for this basename: GLUCAP,  in the last 168 hours     Signed:  Scotland Dost  [facility] 09/20/2014, 2:26 PM

## 2014-09-20 NOTE — Progress Notes (Signed)
Echo shows normal LV systolic function, no WMAs. EKGs and troponins negative, symptoms fairly atypical. Updated patient on findings. Would recommend stress testing for her, medically speaking as an outpatient would be fine but she has reservations about going home today. Reasonable to perform stress test tomorrow AM as inpatient if that is her strong preference. She remains undecided and will speak with her husband, we will follow up her decision. Likely could arrange to be done tomorrow as outpatient as well if she is ok with discharge today.   Zandra Abts MD

## 2014-09-20 NOTE — Consult Note (Signed)
CARDIOLOGY CONSULT NOTE   Patient ID: Stephanie [name] MRN: [id number] DOB/AGE: 1956/05/02 58 y.o.  Admit Date: 09/19/2014 Referring Physician: PTH Primary Physician: Leonides Grills, MD Consulting Cardiologist: Carlyle Dolly MD Primary Cardiologist: New   Reason for Consultation: Chest Pain  Clinical Summary Stephanie [name] is a 58 y.o.female with no known cardiac history but history of DDD, hypertension, hypercholesterolemia,GERD anxiety and depression who saw her PCP yesterday for complaints of chronic back pain, who also mentioned that she has been having intermittent "hard" chest pain for over a year relieved with rest and ASA. She has not had any chest pain since July of this year. However, an EKG was completed in the office and was found to be "abnormal". She was sent to ER for labs and further work-up and admitted. She has been waiting to be scheduled for a stress test, but has not been seen by a cardiologist in the past. .  She describes the pain as "feeling like a ton of bricks on my chest" without associated diaphoresis, dyspnea, weakness or radiation. She is also seeing a neurosurgeon for back issues but has not been planned for surgery. Troponin's are negative X 3. No evidence of anemia, infection, or kidney disease. She has not been taking statin for over 3 months.TSH 2.9.  EKG  RBBB with non-specific T-wave abnormality in the lateral leads. She is not experiencing chest pain now or for several months.    Allergies  Allergen Reactions  . Prednisone Anaphylaxis    Medications Scheduled Medications: . aspirin EC  81 mg Oral Daily  . cyclobenzaprine  10 mg Oral BID  . gabapentin  100 mg Oral BID  . heparin  5,000 Units Subcutaneous 3 times per day  . hydrochlorothiazide  25 mg Oral Daily  . Influenza vac split quadrivalent PF  0.5 mL Intramuscular Tomorrow-1000  . lisinopril  10 mg Oral QPM  . mirtazapine  15 mg Oral QHS  . pantoprazole  40 mg Oral Daily  . simvastatin  10  mg Oral Daily  . sodium chloride  3 mL Intravenous Q12H    Infusions:    PRN Medications: diazepam, ondansetron (ZOFRAN) IV, ondansetron, oxyCODONE-acetaminophen   Past Medical History  Diagnosis Date  . Depression   . Anxiety   . Hypertension   . Hypercholesterolemia   . GERD (gastroesophageal reflux disease)     Past Surgical History  Procedure Laterality Date  . Hernia repair    . Neck surgery    . Abdominal hysterectomy    . Cholecystectomy    . Appendectomy      Family History  Problem Relation Age of Onset  . Parkinson's disease Father     Deceased  . Brain cancer Mother     Deceased    Social History Stephanie [name] reports that she has never smoked. She does not have any smokeless tobacco history on file. Stephanie [name] reports that she does not drink alcohol.  Review of Systems Otherwise reviewed and negative except as outlined.  Physical Examination Blood pressure 97/58, pulse 91, temperature 97.6 F (36.4 C), temperature source Oral, resp. rate 20, height 5\' 2"  (1.575 m), weight 160 lb 8 oz (72.802 kg), SpO2 97.00%.  Intake/Output Summary (Last 24 hours) at 09/20/14 0926 Last data filed at 09/20/14 0700  Gross per 24 hour  Intake    360 ml  Output    400 ml  Net    -40 ml    Telemetry: NSR with ST rate  of 121 bpm.   QJJ:HERDEYC quietly. No acute distress HEENT: Conjunctiva and lids normal, oropharynx clear with moist mucosa. Neck: Supple, no elevated JVP or carotid bruits, no thyromegaly. Lungs: Clear to auscultation, nonlabored breathing at rest. Cardiac: Regular rate and rhythm, no S3 or significant systolic murmur, no pericardial rub. Abdomen: Soft, nontender, no hepatomegaly, bowel sounds present, no guarding or rebound. Extremities: No pitting edema, distal pulses 2+. Skin: Warm and dry. Musculoskeletal: No kyphosis. Neuropsychiatric: Alert and oriented x3, affect grossly appropriate.  Prior Cardiac Testing/Procedures None Lab Results  Basic  Metabolic Panel:  Recent Labs Lab 09/19/14 1357 09/20/14 0326  NA 139 141  K 3.8 3.7  CL 97 99  CO2 29 28  GLUCOSE 105* 114*  BUN 14 13  CREATININE 1.14* 1.07  CALCIUM 9.7 9.4    Liver Function Tests:  Recent Labs Lab 09/19/14 1357 09/20/14 0326  AST 17 16  ALT 23 22  ALKPHOS 97 95  BILITOT 0.2* 0.2*  PROT 7.9 7.5  ALBUMIN 4.1 3.8    CBC:  Recent Labs Lab 09/19/14 1357  WBC 7.3  NEUTROABS 4.3  HGB 12.6  HCT 38.3  MCV 86.8  PLT 265    Cardiac Enzymes:  Recent Labs Lab 09/19/14 1357 09/19/14 1603 09/19/14 2142 09/20/14 0326  TROPONINI <0.30 <0.30 <0.30 <0.30    BNP: No components found with this basename: POCBNP,    Radiology: Dg Chest 2 View  09/19/2014   CLINICAL DATA:  Chronic Chest pain getting worse over the past few days.  EXAM: CHEST  2 VIEW  COMPARISON:  01/28/2008  FINDINGS: The heart size and mediastinal contours are within normal limits. Both lungs are clear. The visualized skeletal structures are unremarkable.  IMPRESSION: No acute cardiopulmonary findings.   Electronically Signed   By: Kalman Jewels M.D.   On: 09/19/2014 14:38     ECG: RBBB with non-specific T-wave abnormalities in the lateral leads.    Impression and Recommendations  1. Hx of Chest Pain: Has not been experiencing chest pain for several weeks, but when she does she describes it at "hard pressure" non-radiating with no other associated symptoms. This is relieved with baby ASA and rest. EKG RBBB with non-specific T-wave abnormalities in the lateral leads. She has CVRF for CAD with hypertension by history, hypercholesterolemia, age. Can plan stress test as OP. No need to keep in hospital to do so unless other medical issues are being worked up.   2. Hx of Hypercholesterolemia: Will need to check fasting lipids and LFTs for status. She states she has been on simvastatin in the past but has not been taking it for 3 months.  3. Hx of Hypertension: On ACE and HCTZ at  home.Slighlty hypotensive on most recent vital signs. Would stop the HCTZ for now. Check Echo  4. DDD: Chronic back pain. Followed by PCP and neurosurgeon.  5. Anxiety and Depression: Can contribute to overall status.   6. Abnormal TSH: 2.9. Follow up by PCP   Signed: Phill Myron. Lawrence NP  09/20/2014, 9:26 AM Co-Sign MD  Attending Note Patient seen and discussed with NP Purcell Nails, I agree with her documentation above. 58 yo female hx of HTN, HL,GERD, anxiety, and depression admitted with chest pain. Reports main episode of pain back in July, where she experienced 10/10 chest pressure with associated SOB that came on at rest. Symptoms lasted over 4 hours consistently, better with aspirin. Since that time has had some milder episodes, all typically lasting several hours.  K 3.7 Cr 1.07, trop neg x 3, Hgb 12.6, Plt 265 CXR no acute process EKG SR, incomplete RBBB, non-specific ST/T changes  Fairly atypical chest pain, primarily in its duration lasting several hours consistently. No evidence of ACS by EKG or cardiac enzymes. Follow up echo results, if normal would consider outpatient stress testing, if abnormal likely would pursue inpatient ischemic testing.  Zandra Abts MD

## 2014-09-20 NOTE — Progress Notes (Signed)
  Echocardiogram 2D Echocardiogram has been performed.  [place], [place] 09/20/2014, 12:11 PM

## 2014-09-23 ENCOUNTER — Telehealth: Payer: Self-pay

## 2014-09-23 ENCOUNTER — Ambulatory Visit (HOSPITAL_COMMUNITY): Admit: 2014-09-23 | Payer: Medicare Other

## 2014-09-23 DIAGNOSIS — R0789 Other chest pain: Secondary | ICD-10-CM

## 2014-09-23 NOTE — Telephone Encounter (Signed)
Order placed

## 2014-09-23 NOTE — Telephone Encounter (Signed)
Message copied by Bernita Raisin on Fri Sep 23, 2014  8:45 AM ------      Message from: Lestine Mount      Created: Fri Sep 23, 2014  8:30 AM      Regarding: need order on stress test put in please       myoview DX cp dr Roderic Palau called over for dr branch so i assume to put under dr branch ------

## 2014-09-27 ENCOUNTER — Encounter (HOSPITAL_COMMUNITY): Payer: Medicare Other

## 2014-09-27 ENCOUNTER — Ambulatory Visit (HOSPITAL_COMMUNITY): Admission: RE | Admit: 2014-09-27 | Payer: Medicare Other | Source: Ambulatory Visit

## 2014-10-05 DIAGNOSIS — R609 Edema, unspecified: Secondary | ICD-10-CM | POA: Diagnosis not present

## 2014-10-05 DIAGNOSIS — M25473 Effusion, unspecified ankle: Secondary | ICD-10-CM | POA: Diagnosis not present

## 2014-10-05 DIAGNOSIS — R079 Chest pain, unspecified: Secondary | ICD-10-CM | POA: Diagnosis not present

## 2014-10-05 DIAGNOSIS — Z79899 Other long term (current) drug therapy: Secondary | ICD-10-CM | POA: Diagnosis not present

## 2014-10-05 DIAGNOSIS — Z90710 Acquired absence of both cervix and uterus: Secondary | ICD-10-CM | POA: Diagnosis not present

## 2014-10-05 DIAGNOSIS — Z7982 Long term (current) use of aspirin: Secondary | ICD-10-CM | POA: Diagnosis not present

## 2014-10-10 DIAGNOSIS — R0602 Shortness of breath: Secondary | ICD-10-CM | POA: Diagnosis not present

## 2014-10-10 DIAGNOSIS — I4510 Unspecified right bundle-branch block: Secondary | ICD-10-CM | POA: Diagnosis not present

## 2014-10-10 DIAGNOSIS — E781 Pure hyperglyceridemia: Secondary | ICD-10-CM | POA: Diagnosis not present

## 2014-10-10 DIAGNOSIS — R9431 Abnormal electrocardiogram [ECG] [EKG]: Secondary | ICD-10-CM | POA: Diagnosis not present

## 2014-10-10 DIAGNOSIS — R079 Chest pain, unspecified: Secondary | ICD-10-CM | POA: Diagnosis not present

## 2014-10-10 DIAGNOSIS — I10 Essential (primary) hypertension: Secondary | ICD-10-CM | POA: Diagnosis not present

## 2014-10-11 ENCOUNTER — Encounter (HOSPITAL_COMMUNITY): Payer: Medicare Other

## 2014-10-11 ENCOUNTER — Inpatient Hospital Stay (HOSPITAL_COMMUNITY): Admission: RE | Admit: 2014-10-11 | Payer: Medicare Other | Source: Ambulatory Visit

## 2014-11-03 DIAGNOSIS — I10 Essential (primary) hypertension: Secondary | ICD-10-CM | POA: Diagnosis not present

## 2014-11-03 DIAGNOSIS — Z23 Encounter for immunization: Secondary | ICD-10-CM | POA: Diagnosis not present

## 2014-11-03 DIAGNOSIS — Z6831 Body mass index (BMI) 31.0-31.9, adult: Secondary | ICD-10-CM | POA: Diagnosis not present

## 2014-11-03 DIAGNOSIS — M545 Low back pain: Secondary | ICD-10-CM | POA: Diagnosis not present

## 2014-11-03 DIAGNOSIS — G894 Chronic pain syndrome: Secondary | ICD-10-CM | POA: Diagnosis not present

## 2014-11-08 DIAGNOSIS — M79643 Pain in unspecified hand: Secondary | ICD-10-CM | POA: Diagnosis not present

## 2014-11-08 DIAGNOSIS — M791 Myalgia: Secondary | ICD-10-CM | POA: Diagnosis not present

## 2014-11-08 DIAGNOSIS — M2550 Pain in unspecified joint: Secondary | ICD-10-CM | POA: Diagnosis not present

## 2014-11-09 DIAGNOSIS — M545 Low back pain: Secondary | ICD-10-CM | POA: Diagnosis not present

## 2014-11-09 DIAGNOSIS — R52 Pain, unspecified: Secondary | ICD-10-CM | POA: Diagnosis not present

## 2014-11-24 DIAGNOSIS — M1991 Primary osteoarthritis, unspecified site: Secondary | ICD-10-CM | POA: Diagnosis not present

## 2014-11-24 DIAGNOSIS — G894 Chronic pain syndrome: Secondary | ICD-10-CM | POA: Diagnosis not present

## 2014-11-24 DIAGNOSIS — Z6831 Body mass index (BMI) 31.0-31.9, adult: Secondary | ICD-10-CM | POA: Diagnosis not present

## 2015-01-02 DIAGNOSIS — F419 Anxiety disorder, unspecified: Secondary | ICD-10-CM | POA: Diagnosis not present

## 2015-01-02 DIAGNOSIS — F329 Major depressive disorder, single episode, unspecified: Secondary | ICD-10-CM | POA: Diagnosis not present

## 2015-01-02 DIAGNOSIS — M1991 Primary osteoarthritis, unspecified site: Secondary | ICD-10-CM | POA: Diagnosis not present

## 2015-01-02 DIAGNOSIS — I10 Essential (primary) hypertension: Secondary | ICD-10-CM | POA: Diagnosis not present

## 2015-01-02 DIAGNOSIS — Z6829 Body mass index (BMI) 29.0-29.9, adult: Secondary | ICD-10-CM | POA: Diagnosis not present

## 2015-01-27 DIAGNOSIS — F329 Major depressive disorder, single episode, unspecified: Secondary | ICD-10-CM | POA: Diagnosis not present

## 2015-01-27 DIAGNOSIS — G894 Chronic pain syndrome: Secondary | ICD-10-CM | POA: Diagnosis not present

## 2015-01-27 DIAGNOSIS — Z6829 Body mass index (BMI) 29.0-29.9, adult: Secondary | ICD-10-CM | POA: Diagnosis not present

## 2015-01-27 DIAGNOSIS — F419 Anxiety disorder, unspecified: Secondary | ICD-10-CM | POA: Diagnosis not present

## 2015-03-16 ENCOUNTER — Other Ambulatory Visit (HOSPITAL_COMMUNITY): Payer: Self-pay | Admitting: Internal Medicine

## 2015-03-16 DIAGNOSIS — Z1231 Encounter for screening mammogram for malignant neoplasm of breast: Secondary | ICD-10-CM

## 2015-04-07 ENCOUNTER — Ambulatory Visit (HOSPITAL_COMMUNITY): Payer: Medicare Other

## 2015-04-07 ENCOUNTER — Encounter: Payer: Self-pay | Admitting: Internal Medicine

## 2015-04-19 DIAGNOSIS — N185 Chronic kidney disease, stage 5: Secondary | ICD-10-CM | POA: Diagnosis not present

## 2015-04-19 DIAGNOSIS — Z6827 Body mass index (BMI) 27.0-27.9, adult: Secondary | ICD-10-CM | POA: Diagnosis not present

## 2015-04-19 DIAGNOSIS — Z79899 Other long term (current) drug therapy: Secondary | ICD-10-CM | POA: Diagnosis not present

## 2015-04-19 DIAGNOSIS — Z8550 Personal history of malignant neoplasm of unspecified urinary tract organ: Secondary | ICD-10-CM | POA: Diagnosis not present

## 2015-04-19 DIAGNOSIS — E781 Pure hyperglyceridemia: Secondary | ICD-10-CM | POA: Diagnosis not present

## 2015-04-19 DIAGNOSIS — F419 Anxiety disorder, unspecified: Secondary | ICD-10-CM | POA: Diagnosis not present

## 2015-04-19 DIAGNOSIS — R5383 Other fatigue: Secondary | ICD-10-CM | POA: Diagnosis not present

## 2015-04-19 DIAGNOSIS — E538 Deficiency of other specified B group vitamins: Secondary | ICD-10-CM | POA: Diagnosis not present

## 2015-04-19 DIAGNOSIS — M1991 Primary osteoarthritis, unspecified site: Secondary | ICD-10-CM | POA: Diagnosis not present

## 2015-04-19 DIAGNOSIS — E785 Hyperlipidemia, unspecified: Secondary | ICD-10-CM | POA: Diagnosis not present

## 2015-04-19 DIAGNOSIS — I10 Essential (primary) hypertension: Secondary | ICD-10-CM | POA: Diagnosis not present

## 2015-04-19 DIAGNOSIS — E663 Overweight: Secondary | ICD-10-CM | POA: Diagnosis not present

## 2015-04-19 DIAGNOSIS — T50905A Adverse effect of unspecified drugs, medicaments and biological substances, initial encounter: Secondary | ICD-10-CM | POA: Diagnosis not present

## 2015-04-20 ENCOUNTER — Other Ambulatory Visit (HOSPITAL_COMMUNITY): Payer: Self-pay | Admitting: Internal Medicine

## 2015-04-20 DIAGNOSIS — R51 Headache: Secondary | ICD-10-CM

## 2015-04-20 DIAGNOSIS — Z1231 Encounter for screening mammogram for malignant neoplasm of breast: Secondary | ICD-10-CM

## 2015-04-20 DIAGNOSIS — R519 Headache, unspecified: Secondary | ICD-10-CM

## 2015-04-20 DIAGNOSIS — G459 Transient cerebral ischemic attack, unspecified: Secondary | ICD-10-CM

## 2015-04-20 DIAGNOSIS — R4182 Altered mental status, unspecified: Secondary | ICD-10-CM

## 2015-04-21 ENCOUNTER — Telehealth: Payer: Self-pay | Admitting: Gastroenterology

## 2015-04-21 ENCOUNTER — Ambulatory Visit: Payer: Medicare Other | Admitting: Gastroenterology

## 2015-04-21 NOTE — Telephone Encounter (Signed)
Pt was a no show

## 2015-04-26 ENCOUNTER — Ambulatory Visit (HOSPITAL_COMMUNITY)
Admission: RE | Admit: 2015-04-26 | Discharge: 2015-04-26 | Disposition: A | Discharge status: Home / Self Care | Payer: Medicare Other | Source: Ambulatory Visit | Attending: Internal Medicine | Admitting: Internal Medicine

## 2015-04-26 ENCOUNTER — Ambulatory Visit: Payer: Medicare Other | Admitting: Gastroenterology

## 2015-04-26 DIAGNOSIS — G459 Transient cerebral ischemic attack, unspecified: Secondary | ICD-10-CM

## 2015-04-26 DIAGNOSIS — R519 Headache, unspecified: Secondary | ICD-10-CM

## 2015-04-26 DIAGNOSIS — R4789 Other speech disturbances: Secondary | ICD-10-CM | POA: Diagnosis not present

## 2015-04-26 DIAGNOSIS — Z1231 Encounter for screening mammogram for malignant neoplasm of breast: Secondary | ICD-10-CM | POA: Diagnosis not present

## 2015-04-26 DIAGNOSIS — R4182 Altered mental status, unspecified: Secondary | ICD-10-CM

## 2015-04-26 DIAGNOSIS — R51 Headache: Secondary | ICD-10-CM

## 2015-05-25 ENCOUNTER — Ambulatory Visit: Payer: BLUE CROSS/BLUE SHIELD | Admitting: Neurology

## 2015-07-20 DIAGNOSIS — Z6825 Body mass index (BMI) 25.0-25.9, adult: Secondary | ICD-10-CM | POA: Diagnosis not present

## 2015-07-20 DIAGNOSIS — R102 Pelvic and perineal pain: Secondary | ICD-10-CM | POA: Diagnosis not present

## 2015-07-20 DIAGNOSIS — Z1389 Encounter for screening for other disorder: Secondary | ICD-10-CM | POA: Diagnosis not present

## 2015-07-20 DIAGNOSIS — E663 Overweight: Secondary | ICD-10-CM | POA: Diagnosis not present

## 2015-07-20 DIAGNOSIS — K2900 Acute gastritis without bleeding: Secondary | ICD-10-CM | POA: Diagnosis not present

## 2015-07-20 DIAGNOSIS — F419 Anxiety disorder, unspecified: Secondary | ICD-10-CM | POA: Diagnosis not present

## 2015-07-20 DIAGNOSIS — K601 Chronic anal fissure: Secondary | ICD-10-CM | POA: Diagnosis not present

## 2015-07-20 DIAGNOSIS — K648 Other hemorrhoids: Secondary | ICD-10-CM | POA: Diagnosis not present

## 2015-07-27 ENCOUNTER — Ambulatory Visit (HOSPITAL_COMMUNITY): Payer: Self-pay | Admitting: Psychiatry

## 2015-08-14 DIAGNOSIS — Z1389 Encounter for screening for other disorder: Secondary | ICD-10-CM | POA: Diagnosis not present

## 2015-08-14 DIAGNOSIS — E663 Overweight: Secondary | ICD-10-CM | POA: Diagnosis not present

## 2015-08-14 DIAGNOSIS — Z6826 Body mass index (BMI) 26.0-26.9, adult: Secondary | ICD-10-CM | POA: Diagnosis not present

## 2015-08-14 DIAGNOSIS — K625 Hemorrhage of anus and rectum: Secondary | ICD-10-CM | POA: Diagnosis not present

## 2015-08-24 ENCOUNTER — Ambulatory Visit (HOSPITAL_COMMUNITY): Payer: Self-pay | Admitting: Psychiatry

## 2015-09-07 ENCOUNTER — Encounter: Payer: Self-pay | Admitting: Internal Medicine

## 2015-09-20 ENCOUNTER — Encounter: Payer: Self-pay | Admitting: Gastroenterology

## 2015-09-20 ENCOUNTER — Ambulatory Visit (INDEPENDENT_AMBULATORY_CARE_PROVIDER_SITE_OTHER): Payer: Medicare Other | Admitting: Gastroenterology

## 2015-09-20 VITALS — BP 103/70 | HR 111 | Temp 98.2°F | Ht 61.0 in | Wt 137.0 lb

## 2015-09-20 DIAGNOSIS — K5900 Constipation, unspecified: Secondary | ICD-10-CM | POA: Insufficient documentation

## 2015-09-20 DIAGNOSIS — K5909 Other constipation: Secondary | ICD-10-CM

## 2015-09-20 MED ORDER — LINACLOTIDE 290 MCG PO CAPS: 290.0000 ug | ORAL_CAPSULE | Freq: Every day | ORAL | Status: DC

## 2015-09-20 NOTE — Patient Instructions (Signed)
Start taking Linzess 1 capsule each morning, 30 minutes before breakfast. This may cause a little diarrhea at first, but it should get better after a few days.   I have sent the prescription to your pharmacy and given you samples.   I would like to see you back in 2-4 weeks to arrange a colonoscopy and see how you are doing.

## 2015-09-20 NOTE — Progress Notes (Signed)
Primary Care Physician:  Glo Herring., MD Primary Gastroenterologist:  Dr. Gala Romney   Chief Complaint  Patient presents with  . Hemorrhoids    HPI:   Stephanie [name] is a 59 y.o. female presenting today at the request of her PCP secondary to hemorrhoids. However, upon entering room, patient immediately starts discussing how she is extremely constipated, difficulty having BMs, and is quite eccentric. Difficult to follow her history, somewhat of a poor historian. After further questioning, she states the following:  On pain medication, stops her up for several weeks. Had to take OTC laxatives. Suppositories wouldn't help. Felt like she was dying at one point. Symptoms present chronically. States since the colonoscopy she had in 2009, her bowels have been "messed up". Feels bloated. Swelling like a pumpkin. States her hemorrhoids are "bleeding" intermittently. Rectal exam performed by PCP without abnormalities per documentation. Primary symptoms of abdominal bloating, discomfort, constipation. Does not endorse rectal pain.   Chronic back pain. Chronic narcotics.   Past Medical History  Diagnosis Date  . Depression   . Anxiety   . Hypertension   . Hypercholesterolemia   . GERD (gastroesophageal reflux disease)   . Chronic pain     Past Surgical History  Procedure Laterality Date  . Hernia repair    . Neck surgery    . Abdominal hysterectomy    . Cholecystectomy    . Appendectomy    . Esophagogastroduodenoscopy  2009    Dr. Gala Romney: normal, small hiatal hernia  . Colonoscopy  2009    Dr. Gala Romney: normal    Current Outpatient Prescriptions  Medication Sig Dispense Refill  . aspirin 81 MG EC tablet Take 81 mg by mouth daily.    Marland Kitchen buPROPion (WELLBUTRIN) 100 MG tablet Take 200 mg by mouth daily.     . cyclobenzaprine (FLEXERIL) 10 MG tablet Take 1 tablet by mouth 2 (two) times daily.    Marland Kitchen dexlansoprazole (DEXILANT) 60 MG capsule Take 60 mg by mouth 2 (two) times daily.    .  diazepam (VALIUM) 10 MG tablet Take 10 mg by mouth 3 (three) times daily as needed for anxiety.     . docusate sodium (COLACE) 100 MG capsule Take 100 mg by mouth 2 (two) times daily.    . hydrochlorothiazide (HYDRODIURIL) 25 MG tablet Take 25 mg by mouth daily.    Marland Kitchen HYDROcodone-acetaminophen (NORCO) 10-325 MG tablet   0  . lisinopril (PRINIVIL,ZESTRIL) 10 MG tablet Take 10 mg by mouth every evening.    . mirtazapine (REMERON) 15 MG tablet Take 15 mg by mouth at bedtime.    . polyethylene glycol (MIRALAX / GLYCOLAX) packet Take 17 g by mouth daily.    . pregabalin (LYRICA) 50 MG capsule Take 50 mg by mouth 3 (three) times daily as needed.    . simvastatin (ZOCOR) 10 MG tablet Take 10 mg by mouth daily.    . tapentadol (NUCYNTA) 50 MG TABS tablet Take 1 tablet by mouth every 8 (eight) hours as needed for moderate pain.     . Linaclotide (LINZESS) 290 MCG CAPS capsule Take 1 capsule (290 mcg total) by mouth daily. 30 minutes before breakfast 30 capsule 5   No current facility-administered medications for this visit.    Allergies as of 09/20/2015 - Review Complete 09/20/2015  Allergen Reaction Noted  . Prednisone Anaphylaxis 09/19/2014    Family History  Problem Relation Age of Onset  . Parkinson's disease Father     Deceased  .  Brain cancer Mother     Deceased  . Colon cancer Father     diagnosed in his 33s     Social History   Social History  . Marital Status: Married    Spouse Name: N/A  . Number of Children: N/A  . Years of Education: N/A   Occupational History  . Not on file.   Social History Main Topics  . Smoking status: Never Smoker   . Smokeless tobacco: Not on file  . Alcohol Use: 0.0 oz/week    0 Standard drinks or equivalent per week     Comment: social alcohol   . Drug Use: No  . Sexual Activity: Yes    Birth Control/ Protection: Surgical   Other Topics Concern  . Not on file   Social History Narrative    Review of Systems: As mentioned in  HPI.  Physical Exam: BP 103/70 mmHg  Pulse 111  Temp(Src) 98.2 F (36.8 C) (Oral)  Ht 5\' 1"  (1.549 m)  Wt 137 lb (62.143 kg)  BMI 25.90 kg/m2 General:   Alert and oriented. Eccentric, talkative.   Head:  Normocephalic and atraumatic. Eyes:  Without icterus, sclera clear and conjunctiva pink.  Ears:  Normal auditory acuity. Nose:  No deformity, discharge,  or lesions. Mouth:  No deformity or lesions, oral mucosa pink.  Lungs:  Clear to auscultation bilaterally. No wheezes, rales, or rhonchi. No distress.  Heart:  S1, S2 present without murmurs appreciated.  Abdomen:  +BS, soft, mild discomfort with deep palpation lower abdomen, LLQ, but no rebound/guarding, distension.  Rectal:  Deferred  Msk:  Symmetrical without gross deformities. Normal posture. Extremities:  Without edema. Neurologic:  Alert and  oriented x4;  grossly normal neurologically. Psych:  Alert and cooperative. Normal mood and affect.

## 2015-09-20 NOTE — Assessment & Plan Note (Signed)
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 Rectal bleeding likely secondary to benign source in setting of known constipation; however, her last colonoscopy was in 2009, and she endorses a positive family history of colon cancer in her father, age 66s. Overdue for high risk screening colonoscopy. With her significant constipation, need to maximize therapy before colonoscopy. We will trial Linzess 290 mcg once daily, return in 2 weeks for further assessment and arrange colonoscopy at that time. She will need Propofol due to polypharmacy.

## 2015-09-21 NOTE — Progress Notes (Signed)
cc'ed to pcp °

## 2015-10-06 ENCOUNTER — Ambulatory Visit: Payer: Self-pay | Admitting: Gastroenterology

## 2015-10-06 ENCOUNTER — Telehealth: Payer: Self-pay | Admitting: Gastroenterology

## 2015-10-06 ENCOUNTER — Encounter: Payer: Self-pay | Admitting: Gastroenterology

## 2015-10-06 NOTE — Telephone Encounter (Signed)
PATIENT WAS A NO SHOW AND LETTER SENT  °

## 2015-10-25 DIAGNOSIS — M1991 Primary osteoarthritis, unspecified site: Secondary | ICD-10-CM | POA: Diagnosis not present

## 2015-10-25 DIAGNOSIS — Z1389 Encounter for screening for other disorder: Secondary | ICD-10-CM | POA: Diagnosis not present

## 2015-10-25 DIAGNOSIS — I10 Essential (primary) hypertension: Secondary | ICD-10-CM | POA: Diagnosis not present

## 2015-10-25 DIAGNOSIS — E782 Mixed hyperlipidemia: Secondary | ICD-10-CM | POA: Diagnosis not present

## 2015-10-25 DIAGNOSIS — R42 Dizziness and giddiness: Secondary | ICD-10-CM | POA: Diagnosis not present

## 2015-10-25 DIAGNOSIS — G894 Chronic pain syndrome: Secondary | ICD-10-CM | POA: Diagnosis not present

## 2015-10-25 DIAGNOSIS — Z6824 Body mass index (BMI) 24.0-24.9, adult: Secondary | ICD-10-CM | POA: Diagnosis not present

## 2015-10-25 DIAGNOSIS — F419 Anxiety disorder, unspecified: Secondary | ICD-10-CM | POA: Diagnosis not present

## 2015-10-30 ENCOUNTER — Ambulatory Visit: Payer: Self-pay | Admitting: Gastroenterology

## 2015-11-10 ENCOUNTER — Encounter: Payer: Self-pay | Admitting: Gastroenterology

## 2015-11-10 ENCOUNTER — Telehealth: Payer: Self-pay | Admitting: Gastroenterology

## 2015-11-10 ENCOUNTER — Ambulatory Visit: Payer: Self-pay | Admitting: Gastroenterology

## 2015-11-10 NOTE — Telephone Encounter (Signed)
Patient no showed for time since May 2016. 3 no-shows since her first and only office visit in October.  Rosendo Gros, should we require referral from her PCP before making future appointments.

## 2015-11-10 NOTE — Telephone Encounter (Signed)
Yes I will flag her chart

## 2015-11-10 NOTE — Telephone Encounter (Signed)
PATIENT WAS A NO SHOW AND LETTER SENT  °

## 2015-11-13 NOTE — Telephone Encounter (Signed)
I just received a call from the patient's daughter stating that her mother is confused and not able to keep up with her appointments.  She is helpin her out with her appoitments.  Please make sure the patient signs a form stating its' okay for the daughter to received information on her behalf.   Okay to schedule per Rosendo Gros

## 2015-11-21 ENCOUNTER — Encounter: Payer: Self-pay | Admitting: Nurse Practitioner

## 2015-11-21 ENCOUNTER — Other Ambulatory Visit: Payer: Self-pay

## 2015-11-21 ENCOUNTER — Ambulatory Visit (INDEPENDENT_AMBULATORY_CARE_PROVIDER_SITE_OTHER): Payer: Medicare Other | Admitting: Nurse Practitioner

## 2015-11-21 VITALS — BP 103/70 | HR 120 | Temp 97.6°F | Ht 61.0 in | Wt 126.4 lb

## 2015-11-21 DIAGNOSIS — K625 Hemorrhage of anus and rectum: Secondary | ICD-10-CM | POA: Insufficient documentation

## 2015-11-21 DIAGNOSIS — K5909 Other constipation: Secondary | ICD-10-CM | POA: Diagnosis not present

## 2015-11-21 MED ORDER — LUBIPROSTONE 24 MCG PO CAPS: 24.0000 ug | ORAL_CAPSULE | Freq: Two times a day (BID) | ORAL | Status: DC

## 2015-11-21 MED ORDER — PEG 3350-KCL-NA BICARB-NACL 420 G PO SOLR: 4000.0000 mL | Freq: Once | ORAL | Status: DC

## 2015-11-21 NOTE — Progress Notes (Signed)
cc'ed to pcp °

## 2015-11-21 NOTE — Patient Instructions (Signed)
1. Stop taking Linzess. 2. Chart taking Amitiza 24 g. Take 1 pill twice daily. Take this after you have had about half of a meal so they just taken on a full stomach in order to prevent nausea. 3. Call and let us know if the Amitiza does not improve your symptoms. 4. We will schedule your procedure for you. 5. Return for follow-up in 3 months.

## 2015-11-21 NOTE — Assessment & Plan Note (Signed)
With intermittent rectal bleeding mostly noted as toilet tissue hematochezia. Family history of colon cancer as her father was diagnosed in his 71s. Last colonoscopy 2009 is currently due for repeat colonoscopy. We'll proceed with colonoscopy as noted above. Return for follow-up in 3 months.

## 2015-11-21 NOTE — Progress Notes (Signed)
Referring Provider: Redmond School, MD Primary Care Physician:  Glo Herring., MD Primary GI:  Dr. Gala Romney  Chief Complaint  Patient presents with  . Follow-up    HPI:   59 year old female presents for follow-up on constipation. Last seen in our office 09/20/2015. At that time she stated constipation with chronic and was noted to be a poor historian and quite eccentric stating one point she thought she was dying because of her constipation. Noted at her bowels have been "messed up" since 2009 when she had a colonoscopy including bloating and swelling like upon can. Also noted intermittent bleeding from her hemorrhoids with rectal exam by PCP noted to be normal. 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 States her father had colon cancer and is his 54s. She is overdue for high risk screening colonoscopy. She was trialed on Linzess 290 g and request a return in 2 weeks for further assessment and arrange for colonoscopy. She is subsequently missed 3 schedule appointments.  Patient was a difficult historian and easily distracted. Today she states she tried Linzess for 2-3 weeks and it did not work. Has a bowel movement currently about once a week. Stomach gets bloated. Abdominal pain is lower abdominal, cramping/sharp, improves after a bowel movement. Has toilet tissue hematochezia "I think it just rips me open and it looks like a fricken' snake." No blood in the toilet lately. Denies fever, chills. Admits occasional nausea, no vomiting. Has had constipation for "a loooooong time." Denies chest pain, dyspnea, dizziness, lightheadedness, syncope, near syncope. Denies any other upper or lower GI symptoms.   Currently taking stool softener daily. No taking Miralax currently.   Past Medical History  Diagnosis Date  . Depression   . Anxiety   . Hypertension   . Hypercholesterolemia   .  GERD (gastroesophageal reflux disease)   . Chronic pain     Past Surgical History  Procedure Laterality Date  . Hernia repair    . Neck surgery    . Abdominal hysterectomy    . Cholecystectomy    . Appendectomy    . Esophagogastroduodenoscopy  2009    Dr. Gala Romney: normal, small hiatal hernia  . Colonoscopy  2009    Dr. Gala Romney: normal    Current Outpatient Prescriptions  Medication Sig Dispense Refill  . aspirin 81 MG EC tablet Take 81 mg by mouth daily.    Marland Kitchen buPROPion (WELLBUTRIN) 100 MG tablet Take 200 mg by mouth daily.     . cyclobenzaprine (FLEXERIL) 10 MG tablet Take 1 tablet by mouth 2 (two) times daily.    Marland Kitchen dexlansoprazole (DEXILANT) 60 MG capsule Take 60 mg by mouth 2 (two) times daily.    . diazepam (VALIUM) 10 MG tablet Take 10 mg by mouth 3 (three) times daily as needed for anxiety.     . docusate sodium (COLACE) 100 MG capsule Take 100 mg by mouth 2 (two) times daily.    . hydrochlorothiazide (HYDRODIURIL) 25 MG tablet Take 25 mg by mouth daily.    Marland Kitchen HYDROcodone-acetaminophen (NORCO) 10-325 MG tablet   0  . HYDROcodone-acetaminophen (NORCO/VICODIN) 5-325 MG tablet   0  . ibuprofen (ADVIL,MOTRIN) 800 MG tablet   0  . Linaclotide (LINZESS) 290 MCG CAPS capsule Take 1 capsule (290 mcg total) by mouth daily. 30 minutes before breakfast 30 capsule 5  . lisinopril (PRINIVIL,ZESTRIL) 10 MG tablet Take 10 mg by mouth every evening.    Marland Kitchen  mirtazapine (REMERON) 15 MG tablet Take 15 mg by mouth at bedtime.    . penicillin v potassium (VEETID) 500 MG tablet   0  . polyethylene glycol (MIRALAX / GLYCOLAX) packet Take 17 g by mouth daily.    . pregabalin (LYRICA) 50 MG capsule Take 50 mg by mouth 3 (three) times daily as needed.    . simvastatin (ZOCOR) 10 MG tablet Take 10 mg by mouth daily.    . tapentadol (NUCYNTA) 50 MG TABS tablet Take 1 tablet by mouth every 8 (eight) hours as needed for moderate pain.      No current facility-administered medications for this visit.     Allergies as of 11/21/2015 - Review Complete 11/21/2015  Allergen Reaction Noted  . Prednisone Anaphylaxis 09/19/2014    Family History  Problem Relation Age of Onset  . Parkinson's disease Father     Deceased  . Brain cancer Mother     Deceased  . Colon cancer Father     diagnosed in his 46s     Social History   Social History  . Marital Status: Married    Spouse Name: N/A  . Number of Children: N/A  . Years of Education: N/A   Social History Main Topics  . Smoking status: Never Smoker   . Smokeless tobacco: None  . Alcohol Use: 0.0 oz/week    0 Standard drinks or equivalent per week     Comment: social alcohol   . Drug Use: No  . Sexual Activity: Yes    Birth Control/ Protection: Surgical   Other Topics Concern  . None   Social History Narrative    Review of Systems: CV: Negative for chest pain, angina, palpitations, peripheral edema.  Respiratory: Negative for dyspnea at rest, cough, sputum, wheezing.  GI: See history of present illness. Derm: Negative for rash or itching.  Endo: Negative for unusual weight change.  Heme: Negative for bruising or bleeding.   Physical Exam: BP 103/70 mmHg  Pulse 120  Temp(Src) 97.6 F (36.4 C) (Oral)  Ht 5\' 1"  (1.549 m)  Wt 126 lb 6.4 oz (57.335 kg)  BMI 23.90 kg/m2 General:   Alert and oriented. Pleasant and cooperative. Well-nourished and well-developed.  Head:  Normocephalic and atraumatic. Ears:  Normal auditory acuity. Cardiovascular:  S1, S2 present without murmurs appreciated. Extremities without clubbing or edema. Respiratory:  Clear to auscultation bilaterally. No wheezes, rales, or rhonchi. No distress.  Gastrointestinal:  +BS, soft,and non-distended. Moderate TTP mid-abdomen. No HSM noted. No guarding or rebound. No masses appreciated.  Rectal:  Deferred  Neurologic:  Alert and oriented x4;  grossly normal neurologically.   11/21/2015 11:29 AM

## 2015-11-21 NOTE — Assessment & Plan Note (Signed)
Patient with history of constipation, has failed Linzess. We will trial her on Amitiza 24 g twice daily. We will provide samples for 2-3 weeks. We will also proceed with a colonoscopy as previously planned to further evaluate her symptoms including rectal bleeding as noted below. Return for follow-up in 3 months.  Proceed with TCS in teh OR with propofol/MAC with Dr. Gala Romney in near future: the risks, benefits, and alternatives have been discussed with the patient in detail. The patient states understanding and desires to proceed.  The patient is currently on Lyrica, Remeron, hydrocodone, Valium, Flexeril, and Wellbutrin. Due to polypharmacy we'll proceed with the procedure and the OR on propofol/MAC to promote adequate sedation.

## 2015-12-04 DIAGNOSIS — Z1389 Encounter for screening for other disorder: Secondary | ICD-10-CM | POA: Diagnosis not present

## 2015-12-04 DIAGNOSIS — N183 Chronic kidney disease, stage 3 (moderate): Secondary | ICD-10-CM | POA: Diagnosis not present

## 2015-12-04 DIAGNOSIS — Z6824 Body mass index (BMI) 24.0-24.9, adult: Secondary | ICD-10-CM | POA: Diagnosis not present

## 2015-12-04 DIAGNOSIS — G894 Chronic pain syndrome: Secondary | ICD-10-CM | POA: Diagnosis not present

## 2015-12-04 DIAGNOSIS — Z23 Encounter for immunization: Secondary | ICD-10-CM | POA: Diagnosis not present

## 2015-12-04 DIAGNOSIS — M1991 Primary osteoarthritis, unspecified site: Secondary | ICD-10-CM | POA: Diagnosis not present

## 2015-12-12 NOTE — Patient Instructions (Signed)
Stephanie [name]  12/12/2015     @PREFPERIOPPHARMACY @   Your procedure is scheduled on 12/18/2015.  Report to [facility] at 6:45 A.M.  Call this number if you have problems the morning of surgery:  [phone]   Remember:  Do not eat food or drink liquids after midnight.  Take these medicines the morning of surgery with A SIP OF WATER:  Wellbutrin, Dexilant, Valium, Lisinopril and Lyrica   Do not wear jewelry, make-up or nail polish.  Do not wear lotions, powders, or perfumes.  You may wear deodorant.  Do not shave 48 hours prior to surgery.  Men may shave face and neck.  Do not bring valuables to the hospital.  [facility] is not responsible for any belongings or valuables.  Contacts, dentures or bridgework may not be worn into surgery.  Leave your suitcase in the car.  After surgery it may be brought to your room.  For patients admitted to the hospital, discharge time will be determined by your treatment team.  Patients discharged the day of surgery will not be allowed to drive home.   Name and phone number of your driver:   family Special instructions:  Na/  Please read over the following fact sheets that you were given. Care and Recovery After Surgery     Colonoscopy A colonoscopy is an exam to look at the entire large intestine (colon). This exam can help find problems such as tumors, polyps, inflammation, and areas of bleeding. The exam takes about 1 hour.  LET [facility] CARE PROVIDER KNOW ABOUT:   Any allergies you have.  All medicines you are taking, including vitamins, herbs, eye drops, creams, and over-the-counter medicines.  Previous problems you or members of your family have had with the use of anesthetics.  Any blood disorders you have.  Previous surgeries you have had.  Medical conditions you have. RISKS AND COMPLICATIONS  Generally, this is a safe procedure. However, as with any procedure, complications can occur. Possible complications  include:  Bleeding.  Tearing or rupture of the colon wall.  Reaction to medicines given during the exam.  Infection (rare). BEFORE THE PROCEDURE   Ask your health care provider about changing or stopping your regular medicines.  You may be prescribed an oral bowel prep. This involves drinking a large amount of medicated liquid, starting the day before your procedure. The liquid will cause you to have multiple loose stools until your stool is almost clear or light green. This cleans out your colon in preparation for the procedure.  Do not eat or drink anything else once you have started the bowel prep, unless your health care provider tells you it is safe to do so.  Arrange for someone to drive you home after the procedure. PROCEDURE   You will be given medicine to help you relax (sedative).  You will lie on your side with your knees bent.  A long, flexible tube with a light and camera on the end (colonoscope) will be inserted through the rectum and into the colon. The camera sends video back to a computer screen as it moves through the colon. The colonoscope also releases carbon dioxide gas to inflate the colon. This helps your health care provider see the area better.  During the exam, your health care provider may take a small tissue sample (biopsy) to be examined under a microscope if any abnormalities are found.  The exam is finished when the entire colon has been viewed. AFTER  THE PROCEDURE   Do not drive for 24 hours after the exam.  You may have a small amount of blood in your stool.  You may pass moderate amounts of gas and have mild abdominal cramping or bloating. This is caused by the gas used to inflate your colon during the exam.  Ask when your test results will be ready and how you will get your results. Make sure you get your test results.   This information is not intended to replace advice given to you by your health care provider. Make sure you discuss any  questions you have with your health care provider.   Document Released: 11/08/2000 Document Revised: 09/01/2013 Document Reviewed: 07/19/2013 Elsevier Interactive Patient Education [employer].

## 2015-12-13 ENCOUNTER — Encounter (HOSPITAL_COMMUNITY)
Admission: RE | Admit: 2015-12-13 | Discharge: 2015-12-13 | Disposition: A | Discharge status: Home / Self Care | Payer: Medicare Other | Source: Ambulatory Visit | Attending: Internal Medicine | Admitting: Internal Medicine

## 2015-12-13 ENCOUNTER — Encounter (HOSPITAL_COMMUNITY): Payer: Self-pay

## 2015-12-13 ENCOUNTER — Other Ambulatory Visit: Payer: Self-pay

## 2015-12-13 DIAGNOSIS — Z01812 Encounter for preprocedural laboratory examination: Secondary | ICD-10-CM | POA: Insufficient documentation

## 2015-12-13 DIAGNOSIS — Z01810 Encounter for preprocedural cardiovascular examination: Secondary | ICD-10-CM | POA: Diagnosis not present

## 2015-12-13 HISTORY — DX: Other chronic pain: G89.29

## 2015-12-13 HISTORY — DX: Cervicalgia: M54.2

## 2015-12-13 HISTORY — DX: Dorsalgia, unspecified: M54.9

## 2015-12-13 LAB — CBC WITH DIFFERENTIAL/PLATELET
Basophils Absolute: 0 10*3/uL (ref 0.0–0.1)
Basophils Relative: 0 %
Eosinophils Absolute: 0.2 10*3/uL (ref 0.0–0.7)
Eosinophils Relative: 2 %
HCT: 30.4 % — ABNORMAL LOW (ref 36.0–46.0)
Hemoglobin: 10.3 g/dL — ABNORMAL LOW (ref 12.0–15.0)
LYMPHS ABS: 1.9 10*3/uL (ref 0.7–4.0)
LYMPHS PCT: 30 %
MCH: 30.7 pg (ref 26.0–34.0)
MCHC: 33.9 g/dL (ref 30.0–36.0)
MCV: 90.5 fL (ref 78.0–100.0)
MONO ABS: 0.3 10*3/uL (ref 0.1–1.0)
MONOS PCT: 5 %
Neutro Abs: 3.8 10*3/uL (ref 1.7–7.7)
Neutrophils Relative %: 63 %
PLATELETS: 254 10*3/uL (ref 150–400)
RBC: 3.36 MIL/uL — ABNORMAL LOW (ref 3.87–5.11)
RDW: 14 % (ref 11.5–15.5)
WBC: 6.2 10*3/uL (ref 4.0–10.5)

## 2015-12-13 LAB — BASIC METABOLIC PANEL WITH GFR
Anion gap: 7 (ref 5–15)
BUN: 22 mg/dL — ABNORMAL HIGH (ref 6–20)
CO2: 29 mmol/L (ref 22–32)
Calcium: 8.7 mg/dL — ABNORMAL LOW (ref 8.9–10.3)
Chloride: 107 mmol/L (ref 101–111)
Creatinine, Ser: 1.6 mg/dL — ABNORMAL HIGH (ref 0.44–1.00)
GFR calc Af Amer: 40 mL/min — ABNORMAL LOW
GFR calc non Af Amer: 34 mL/min — ABNORMAL LOW
Glucose, Bld: 121 mg/dL — ABNORMAL HIGH (ref 65–99)
Potassium: 3.5 mmol/L (ref 3.5–5.1)
Sodium: 143 mmol/L (ref 135–145)

## 2015-12-13 LAB — BASIC METABOLIC PANEL: Creatinine: 1.6 mg/dL — AB (ref <=1.1)

## 2015-12-13 NOTE — Pre-Procedure Instructions (Signed)
Patient given information to sign up for my chart at home. 

## 2015-12-18 ENCOUNTER — Encounter (HOSPITAL_COMMUNITY)
Admission: RE | Disposition: A | Discharge status: Home / Self Care | Payer: Self-pay | Source: Ambulatory Visit | Attending: Internal Medicine

## 2015-12-18 ENCOUNTER — Encounter (HOSPITAL_COMMUNITY): Payer: Self-pay | Admitting: *Deleted

## 2015-12-18 ENCOUNTER — Ambulatory Visit (HOSPITAL_COMMUNITY): Payer: Medicare Other | Admitting: Anesthesiology

## 2015-12-18 ENCOUNTER — Ambulatory Visit (HOSPITAL_COMMUNITY)
Admission: RE | Admit: 2015-12-18 | Discharge: 2015-12-18 | Disposition: A | Discharge status: Home / Self Care | Payer: Medicare Other | Source: Ambulatory Visit | Attending: Internal Medicine | Admitting: Internal Medicine

## 2015-12-18 DIAGNOSIS — I10 Essential (primary) hypertension: Secondary | ICD-10-CM | POA: Diagnosis not present

## 2015-12-18 DIAGNOSIS — K5900 Constipation, unspecified: Secondary | ICD-10-CM | POA: Diagnosis not present

## 2015-12-18 DIAGNOSIS — R194 Change in bowel habit: Secondary | ICD-10-CM | POA: Diagnosis not present

## 2015-12-18 DIAGNOSIS — E7800 Pure hypercholesterolemia, unspecified: Secondary | ICD-10-CM | POA: Insufficient documentation

## 2015-12-18 DIAGNOSIS — Z7982 Long term (current) use of aspirin: Secondary | ICD-10-CM | POA: Diagnosis not present

## 2015-12-18 DIAGNOSIS — F419 Anxiety disorder, unspecified: Secondary | ICD-10-CM | POA: Insufficient documentation

## 2015-12-18 DIAGNOSIS — K921 Melena: Secondary | ICD-10-CM | POA: Diagnosis not present

## 2015-12-18 DIAGNOSIS — Z800 Family history of malignant neoplasm of digestive organs: Secondary | ICD-10-CM | POA: Diagnosis not present

## 2015-12-18 DIAGNOSIS — F329 Major depressive disorder, single episode, unspecified: Secondary | ICD-10-CM | POA: Diagnosis not present

## 2015-12-18 DIAGNOSIS — Z79899 Other long term (current) drug therapy: Secondary | ICD-10-CM | POA: Diagnosis not present

## 2015-12-18 DIAGNOSIS — K648 Other hemorrhoids: Secondary | ICD-10-CM | POA: Diagnosis not present

## 2015-12-18 DIAGNOSIS — K625 Hemorrhage of anus and rectum: Secondary | ICD-10-CM | POA: Diagnosis not present

## 2015-12-18 DIAGNOSIS — K649 Unspecified hemorrhoids: Secondary | ICD-10-CM | POA: Diagnosis not present

## 2015-12-18 DIAGNOSIS — K219 Gastro-esophageal reflux disease without esophagitis: Secondary | ICD-10-CM | POA: Insufficient documentation

## 2015-12-18 HISTORY — PX: COLONOSCOPY WITH PROPOFOL: SHX5780

## 2015-12-18 SURGERY — COLONOSCOPY WITH PROPOFOL
Anesthesia: Monitor Anesthesia Care

## 2015-12-18 MED ORDER — PROPOFOL 10 MG/ML IV BOLUS
INTRAVENOUS | Status: DC | PRN
  Administered 2015-12-18 (×3): 10 mg via INTRAVENOUS

## 2015-12-18 MED ORDER — ONDANSETRON HCL 4 MG/2ML IJ SOLN: 4.0000 mg | Freq: Once | INTRAMUSCULAR | Status: DC | PRN

## 2015-12-18 MED ORDER — GLYCOPYRROLATE 0.2 MG/ML IJ SOLN
0.2000 mg | Freq: Once | INTRAMUSCULAR | Status: AC
  Administered 2015-12-18: 0.2 mg via INTRAVENOUS

## 2015-12-18 MED ORDER — ONDANSETRON HCL 4 MG/2ML IJ SOLN
INTRAMUSCULAR | Status: AC
  Filled 2015-12-18: qty 2

## 2015-12-18 MED ORDER — LACTATED RINGERS IV SOLN
INTRAVENOUS | Status: DC
  Administered 2015-12-18: 08:00:00 via INTRAVENOUS

## 2015-12-18 MED ORDER — MIDAZOLAM HCL 2 MG/2ML IJ SOLN
1.0000 mg | INTRAMUSCULAR | Status: DC | PRN
  Administered 2015-12-18: 2 mg via INTRAVENOUS

## 2015-12-18 MED ORDER — FENTANYL CITRATE (PF) 100 MCG/2ML IJ SOLN
INTRAMUSCULAR | Status: AC
  Filled 2015-12-18: qty 2

## 2015-12-18 MED ORDER — FENTANYL CITRATE (PF) 100 MCG/2ML IJ SOLN
25.0000 ug | INTRAMUSCULAR | Status: AC
  Administered 2015-12-18 (×2): 25 ug via INTRAVENOUS

## 2015-12-18 MED ORDER — PROPOFOL 500 MG/50ML IV EMUL
INTRAVENOUS | Status: DC | PRN
  Administered 2015-12-18: 125 ug/kg/min via INTRAVENOUS

## 2015-12-18 MED ORDER — FENTANYL CITRATE (PF) 100 MCG/2ML IJ SOLN: 25.0000 ug | INTRAMUSCULAR | Status: DC | PRN

## 2015-12-18 MED ORDER — GLYCOPYRROLATE 0.2 MG/ML IJ SOLN
INTRAMUSCULAR | Status: AC
  Filled 2015-12-18: qty 1

## 2015-12-18 MED ORDER — PROPOFOL 10 MG/ML IV BOLUS
INTRAVENOUS | Status: AC
  Filled 2015-12-18: qty 40

## 2015-12-18 MED ORDER — MIDAZOLAM HCL 2 MG/2ML IJ SOLN
INTRAMUSCULAR | Status: AC
  Filled 2015-12-18: qty 2

## 2015-12-18 MED ORDER — ONDANSETRON HCL 4 MG/2ML IJ SOLN
4.0000 mg | Freq: Once | INTRAMUSCULAR | Status: AC
  Administered 2015-12-18: 4 mg via INTRAVENOUS

## 2015-12-18 NOTE — Anesthesia Preprocedure Evaluation (Signed)
Anesthesia Evaluation  Patient identified by MRN, date of birth, ID band Patient awake    Reviewed: Allergy & Precautions, NPO status , Patient's Chart, lab work & pertinent test results  Airway Mallampati: III  TM Distance: <3 FB   Mouth opening: Limited Mouth Opening  Dental  (+) Teeth Intact, Missing,    Pulmonary neg pulmonary ROS,    breath sounds clear to auscultation       Cardiovascular hypertension, Pt. on medications  Rhythm:Regular Rate:Normal     Neuro/Psych PSYCHIATRIC DISORDERS Anxiety Depression    GI/Hepatic GERD  ,  Endo/Other    Renal/GU      Musculoskeletal   Abdominal   Peds  Hematology   Anesthesia Other Findings Chronic neck & back pain syndrome.  Reproductive/Obstetrics                             Anesthesia Physical Anesthesia Plan  ASA: III  Anesthesia Plan: MAC   Post-op Pain Management:    Induction: Intravenous  Airway Management Planned: Simple Face Mask  Additional Equipment:   Intra-op Plan:   Post-operative Plan:   Informed Consent: I have reviewed the patients History and Physical, chart, labs and discussed the procedure including the risks, benefits and alternatives for the proposed anesthesia with the patient or authorized representative who has indicated his/her understanding and acceptance.     Plan Discussed with:   Anesthesia Plan Comments:         Anesthesia Quick Evaluation

## 2015-12-18 NOTE — Interval H&P Note (Signed)
History and Physical Interval Note:  12/18/2015 8:22 AM  Stephanie [name]  has presented today for surgery, with the diagnosis of constipation, rectal bleeding  The various methods of treatment have been discussed with the patient and family. After consideration of risks, benefits and other options for treatment, the patient has consented to  Procedure(s) with comments: COLONOSCOPY WITH PROPOFOL (N/A) - 0815 as a surgical intervention .  The patient's history has been reviewed, patient examined, no change in status, stable for surgery.  I have reviewed the patient's chart and labs.  Questions were answered to the patient's satisfaction.     Stephanie [name]  No change. Diagnostic colonoscopy per plan.  The risks, benefits, limitations, alternatives and imponderables have been reviewed with the patient. Questions have been answered. All parties are agreeable.

## 2015-12-18 NOTE — Transfer of Care (Signed)
Immediate Anesthesia Transfer of Care Note  Patient: Stephanie [name]  Procedure(s) Performed: Procedure(s) with comments: COLONOSCOPY WITH PROPOFOL (N/A) - 0815  Patient Location: PACU  Anesthesia Type:MAC  Level of Consciousness: awake, alert  and oriented  Airway & Oxygen Therapy: Patient Spontanous Breathing and Patient connected to nasal cannula oxygen  Post-op Assessment: Report given to RN  Post vital signs: Reviewed and stable  Last Vitals:  Filed Vitals:   12/18/15 0820 12/18/15 0825  BP: 113/73 122/78  Pulse:    Temp:    Resp: 38 15    Complications: No apparent anesthesia complications

## 2015-12-18 NOTE — H&P (View-Only) (Signed)
Referring Provider: Redmond School, MD Primary Care Physician:  Glo Herring., MD Primary GI:  Dr. Gala Romney  Chief Complaint  Patient presents with  . Follow-up    HPI:   60 year old female presents for follow-up on constipation. Last seen in our office 09/20/2015. At that time she stated constipation with chronic and was noted to be a poor historian and quite eccentric stating one point she thought she was dying because of her constipation. Noted at her bowels have been "messed up" since 2009 when she had a colonoscopy including bloating and swelling like upon can. Also noted intermittent bleeding from her hemorrhoids with rectal exam by PCP noted to be normal. 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 States her father had colon cancer and is his 73s. She is overdue for high risk screening colonoscopy. She was trialed on Linzess 290 g and request a return in 2 weeks for further assessment and arrange for colonoscopy. She is subsequently missed 3 schedule appointments.  Patient was a difficult historian and easily distracted. Today she states she tried Linzess for 2-3 weeks and it did not work. Has a bowel movement currently about once a week. Stomach gets bloated. Abdominal pain is lower abdominal, cramping/sharp, improves after a bowel movement. Has toilet tissue hematochezia "I think it just rips me open and it looks like a fricken' snake." No blood in the toilet lately. Denies fever, chills. Admits occasional nausea, no vomiting. Has had constipation for "a loooooong time." Denies chest pain, dyspnea, dizziness, lightheadedness, syncope, near syncope. Denies any other upper or lower GI symptoms.   Currently taking stool softener daily. No taking Miralax currently.   Past Medical History  Diagnosis Date  . Depression   . Anxiety   . Hypertension   . Hypercholesterolemia   .  GERD (gastroesophageal reflux disease)   . Chronic pain     Past Surgical History  Procedure Laterality Date  . Hernia repair    . Neck surgery    . Abdominal hysterectomy    . Cholecystectomy    . Appendectomy    . Esophagogastroduodenoscopy  2009    Dr. Gala Romney: normal, small hiatal hernia  . Colonoscopy  2009    Dr. Gala Romney: normal    Current Outpatient Prescriptions  Medication Sig Dispense Refill  . aspirin 81 MG EC tablet Take 81 mg by mouth daily.    Marland Kitchen buPROPion (WELLBUTRIN) 100 MG tablet Take 200 mg by mouth daily.     . cyclobenzaprine (FLEXERIL) 10 MG tablet Take 1 tablet by mouth 2 (two) times daily.    Marland Kitchen dexlansoprazole (DEXILANT) 60 MG capsule Take 60 mg by mouth 2 (two) times daily.    . diazepam (VALIUM) 10 MG tablet Take 10 mg by mouth 3 (three) times daily as needed for anxiety.     . docusate sodium (COLACE) 100 MG capsule Take 100 mg by mouth 2 (two) times daily.    . hydrochlorothiazide (HYDRODIURIL) 25 MG tablet Take 25 mg by mouth daily.    Marland Kitchen HYDROcodone-acetaminophen (NORCO) 10-325 MG tablet   0  . HYDROcodone-acetaminophen (NORCO/VICODIN) 5-325 MG tablet   0  . ibuprofen (ADVIL,MOTRIN) 800 MG tablet   0  . Linaclotide (LINZESS) 290 MCG CAPS capsule Take 1 capsule (290 mcg total) by mouth daily. 30 minutes before breakfast 30 capsule 5  . lisinopril (PRINIVIL,ZESTRIL) 10 MG tablet Take 10 mg by mouth every evening.    Marland Kitchen  mirtazapine (REMERON) 15 MG tablet Take 15 mg by mouth at bedtime.    . penicillin v potassium (VEETID) 500 MG tablet   0  . polyethylene glycol (MIRALAX / GLYCOLAX) packet Take 17 g by mouth daily.    . pregabalin (LYRICA) 50 MG capsule Take 50 mg by mouth 3 (three) times daily as needed.    . simvastatin (ZOCOR) 10 MG tablet Take 10 mg by mouth daily.    . tapentadol (NUCYNTA) 50 MG TABS tablet Take 1 tablet by mouth every 8 (eight) hours as needed for moderate pain.      No current facility-administered medications for this visit.     Allergies as of 11/21/2015 - Review Complete 11/21/2015  Allergen Reaction Noted  . Prednisone Anaphylaxis 09/19/2014    Family History  Problem Relation Age of Onset  . Parkinson's disease Father     Deceased  . Brain cancer Mother     Deceased  . Colon cancer Father     diagnosed in his 41s     Social History   Social History  . Marital Status: Married    Spouse Name: N/A  . Number of Children: N/A  . Years of Education: N/A   Social History Main Topics  . Smoking status: Never Smoker   . Smokeless tobacco: None  . Alcohol Use: 0.0 oz/week    0 Standard drinks or equivalent per week     Comment: social alcohol   . Drug Use: No  . Sexual Activity: Yes    Birth Control/ Protection: Surgical   Other Topics Concern  . None   Social History Narrative    Review of Systems: CV: Negative for chest pain, angina, palpitations, peripheral edema.  Respiratory: Negative for dyspnea at rest, cough, sputum, wheezing.  GI: See history of present illness. Derm: Negative for rash or itching.  Endo: Negative for unusual weight change.  Heme: Negative for bruising or bleeding.   Physical Exam: BP 103/70 mmHg  Pulse 120  Temp(Src) 97.6 F (36.4 C) (Oral)  Ht 5\' 1"  (1.549 m)  Wt 126 lb 6.4 oz (57.335 kg)  BMI 23.90 kg/m2 General:   Alert and oriented. Pleasant and cooperative. Well-nourished and well-developed.  Head:  Normocephalic and atraumatic. Ears:  Normal auditory acuity. Cardiovascular:  S1, S2 present without murmurs appreciated. Extremities without clubbing or edema. Respiratory:  Clear to auscultation bilaterally. No wheezes, rales, or rhonchi. No distress.  Gastrointestinal:  +BS, soft,and non-distended. Moderate TTP mid-abdomen. No HSM noted. No guarding or rebound. No masses appreciated.  Rectal:  Deferred  Neurologic:  Alert and oriented x4;  grossly normal neurologically.   11/21/2015 11:29 AM

## 2015-12-18 NOTE — Discharge Instructions (Addendum)
Colonoscopy Discharge Instructions  Read the instructions outlined below and refer to this sheet in the next few weeks. These discharge instructions provide you with general information on caring for yourself after you leave the hospital. Your doctor may also give you specific instructions. While your treatment has been planned according to the most current medical practices available, unavoidable complications occasionally occur. If you have any problems or questions after discharge, call Dr. Gala Romney at [phone]. ACTIVITY  You may resume your regular activity, but move at a slower pace for the next 24 hours.   Take frequent rest periods for the next 24 hours.   Walking will help get rid of the air and reduce the bloated feeling in your belly (abdomen).   No driving for 24 hours (because of the medicine (anesthesia) used during the test).    Do not sign any important legal documents or operate any machinery for 24 hours (because of the anesthesia used during the test).  NUTRITION  Drink plenty of fluids.   You may resume your normal diet as instructed by your doctor.   Begin with a light meal and progress to your normal diet. Heavy or fried foods are harder to digest and may make you feel sick to your stomach (nauseated).   Avoid alcoholic beverages for 24 hours or as instructed.  MEDICATIONS  You may resume your normal medications unless your doctor tells you otherwise.  WHAT YOU CAN EXPECT TODAY  Some feelings of bloating in the abdomen.   Passage of more gas than usual.   Spotting of blood in your stool or on the toilet paper.  IF YOU HAD POLYPS REMOVED DURING THE COLONOSCOPY:  No aspirin products for 7 days or as instructed.   No alcohol for 7 days or as instructed.   Eat a soft diet for the next 24 hours.  FINDING OUT THE RESULTS OF YOUR TEST Not all test results are available during your visit. If your test results are not back during the visit, make an appointment  with your caregiver to find out the results. Do not assume everything is normal if you have not heard from your caregiver or the medical facility. It is important for you to follow up on all of your test results.  SEEK IMMEDIATE MEDICAL ATTENTION IF:  You have more than a spotting of blood in your stool.   Your belly is swollen (abdominal distention).   You are nauseated or vomiting.   You have a temperature over 101.   You have abdominal pain or discomfort that is severe or gets worse throughout the day.    Hemorrhoid and constipation information provided  Continue Amitiza 24 g 1 twice daily during meals.  Office visit with [place] in 4-6 weeks. If hemorrhoids continue to be a problem can consider banding in the office  Hemorrhoids Hemorrhoids are swollen veins around the rectum or anus. There are two types of hemorrhoids:   Internal hemorrhoids. These occur in the veins just inside the rectum. They may poke through to the outside and become irritated and painful.  External hemorrhoids. These occur in the veins outside the anus and can be felt as a painful swelling or hard lump near the anus. CAUSES  Pregnancy.   Obesity.   Constipation or diarrhea.   Straining to have a bowel movement.   Sitting for long periods on the toilet.  Heavy lifting or other activity that caused you to strain.  Anal intercourse. SYMPTOMS   Pain.  Anal itching or irritation.   Rectal bleeding.   Fecal leakage.   Anal swelling.   One or more lumps around the anus.  DIAGNOSIS  Your caregiver may be able to diagnose hemorrhoids by visual examination. Other examinations or tests that may be performed include:   Examination of the rectal area with a gloved hand (digital rectal exam).   Examination of anal canal using a small tube (scope).   A blood test if you have lost a significant amount of blood.  A test to look inside the colon (sigmoidoscopy or  colonoscopy). TREATMENT Most hemorrhoids can be treated at home. However, if symptoms do not seem to be getting better or if you have a lot of rectal bleeding, your caregiver may perform a procedure to help make the hemorrhoids get smaller or remove them completely. Possible treatments include:   Placing a rubber band at the base of the hemorrhoid to cut off the circulation (rubber band ligation).   Injecting a chemical to shrink the hemorrhoid (sclerotherapy).   Using a tool to burn the hemorrhoid (infrared light therapy).   Surgically removing the hemorrhoid (hemorrhoidectomy).   Stapling the hemorrhoid to block blood flow to the tissue (hemorrhoid stapling).  HOME CARE INSTRUCTIONS   Eat foods with fiber, such as whole grains, beans, nuts, fruits, and vegetables. Ask your doctor about taking products with added fiber in them (fibersupplements).  Increase fluid intake. Drink enough water and fluids to keep your urine clear or pale yellow.   Exercise regularly.   Go to the bathroom when you have the urge to have a bowel movement. Do not wait.   Avoid straining to have bowel movements.   Keep the anal area dry and clean. Use wet toilet paper or moist towelettes after a bowel movement.   Medicated creams and suppositories may be used or applied as directed.   Only take over-the-counter or prescription medicines as directed by your caregiver.   Take warm sitz baths for 15-20 minutes, 3-4 times a day to ease pain and discomfort.   Place ice packs on the hemorrhoids if they are tender and swollen. Using ice packs between sitz baths may be helpful.   Put ice in a plastic bag.   Place a towel between your skin and the bag.   Leave the ice on for 15-20 minutes, 3-4 times a day.   Do not use a donut-shaped pillow or sit on the toilet for long periods. This increases blood pooling and pain.  SEEK MEDICAL CARE IF:  You have increasing pain and swelling that is not  controlled by treatment or medicine.  You have uncontrolled bleeding.  You have difficulty or you are unable to have a bowel movement.  You have pain or inflammation outside the area of the hemorrhoids. MAKE SURE YOU:  Understand these instructions.  Will watch your condition.  Will get help right away if you are not doing well or get worse.   This information is not intended to replace advice given to you by your health care provider. Make sure you discuss any questions you have with your health care provider.   Document Released: 11/08/2000 Document Revised: 10/28/2012 Document Reviewed: 09/15/2012 Elsevier Interactive Patient Education 2016 [employer].   Constipation, Adult Constipation is when a person has fewer than three bowel movements a week, has difficulty having a bowel movement, or has stools that are dry, hard, or larger than normal. As people grow older, constipation is more common. A low-fiber  diet, not taking in enough fluids, and taking certain medicines may make constipation worse.  CAUSES   Certain medicines, such as antidepressants, pain medicine, iron supplements, antacids, and water pills.   Certain diseases, such as diabetes, irritable bowel syndrome (IBS), thyroid disease, or depression.   Not drinking enough water.   Not eating enough fiber-rich foods.   Stress or travel.   Lack of physical activity or exercise.   Ignoring the urge to have a bowel movement.   Using laxatives too much.  SIGNS AND SYMPTOMS   Having fewer than three bowel movements a week.   Straining to have a bowel movement.   Having stools that are hard, dry, or larger than normal.   Feeling full or bloated.   Pain in the lower abdomen.   Not feeling relief after having a bowel movement.  DIAGNOSIS  Your health care provider will take a medical history and perform a physical exam. Further testing may be done for severe constipation. Some tests may  include:  A barium enema X-ray to examine your rectum, colon, and, sometimes, your small intestine.   A sigmoidoscopy to examine your lower colon.   A colonoscopy to examine your entire colon. TREATMENT  Treatment will depend on the severity of your constipation and what is causing it. Some dietary treatments include drinking more fluids and eating more fiber-rich foods. Lifestyle treatments may include regular exercise. If these diet and lifestyle recommendations do not help, your health care provider may recommend taking over-the-counter laxative medicines to help you have bowel movements. Prescription medicines may be prescribed if over-the-counter medicines do not work.  HOME CARE INSTRUCTIONS   Eat foods that have a lot of fiber, such as fruits, vegetables, whole grains, and beans.  Limit foods high in fat and processed sugars, such as french fries, hamburgers, cookies, candies, and soda.   A fiber supplement may be added to your diet if you cannot get enough fiber from foods.   Drink enough fluids to keep your urine clear or pale yellow.   Exercise regularly or as directed by your health care provider.   Go to the restroom when you have the urge to go. Do not hold it.   Only take over-the-counter or prescription medicines as directed by your health care provider. Do not take other medicines for constipation without talking to your health care provider first.  [place] IF:   You have bright red blood in your stool.   Your constipation lasts for more than 4 days or gets worse.   You have abdominal or rectal pain.   You have thin, pencil-like stools.   You have unexplained weight loss. MAKE SURE YOU:   Understand these instructions.  Will watch your condition.  Will get help right away if you are not doing well or get worse.   This information is not intended to replace advice given to you by your health care provider. Make sure you discuss  any questions you have with your health care provider.   Document Released: 08/09/2004 Document Revised: 12/02/2014 Document Reviewed: 08/23/2013 Elsevier Interactive Patient Education [employer].

## 2015-12-18 NOTE — Op Note (Signed)
[facility] [address] [place], [address]   COLONOSCOPY PROCEDURE REPORT  PATIENT: Stephanie [name], Stephanie [name]  MR#: [id number] BIRTHDATE: December 24, 1955 , 80  yrs. old GENDER: female ENDOSCOPIST: R.  Garfield Cornea, MD FACP [facility] REFERRED [id number] Gerarda Fraction, M.D. PROCEDURE DATE:  01/08/2016 PROCEDURE:   Colonoscopy, diagnostic INDICATIONS:change in bowel habits; paper hematochezia. MEDICATIONS: Deep sedation per Dr.  [facility] ASA CLASS:       Class III  CONSENT: The risks, benefits, alternatives and imponderables including but not limited to bleeding, perforation as well as the possibility of a missed lesion have been reviewed.  The potential for biopsy, lesion removal, etc. have also been discussed. Questions have been answered.  All parties agreeable.  Please see the history and physical in the medical record for more information.  DESCRIPTION OF PROCEDURE:   After the risks benefits and alternatives of the procedure were thoroughly explained, informed consent was obtained.  The digital rectal exam revealed no abnormalities of the rectum.   The [id number]  endoscope was introduced through the anus and advanced to the cecum, which was identified by both the appendix and ileocecal valve. No adverse events experienced.   The quality of the prep was adequate  The instrument was then slowly withdrawn as the colon was fully examined. Estimated blood loss is zero unless otherwise noted in this procedure report.      COLON FINDINGS: Anal canal/internal hemorrhoids; otherwise, normal-appearing rectal mucosa.  Normal-appearing colonic mucosa. Initially, colonic effluent was greasy and produced a film on the scope along with quite a bit of viscous effluent as well.  Copious washing and suctioning needed to obtain adequate views. Retroflexion was performed. .  Withdrawal time=9 minutes 0 seconds.  The scope was withdrawn and the procedure  completed. COMPLICATIONS: There were no immediate complications.  ENDOSCOPIC IMPRESSION: Anal canal/internal hemorrhoids?"likely source of hematochezia. Otherwise, normal colonoscopy  RECOMMENDATIONS: Continue Amitiza 24 g twice a day during meals. Office visit in 4-6 weeks. Patient still having hemorrhoid issues after adequate management of constipation accomplished, could consider hemorrhoid banding.  eSigned:  R. Garfield Cornea, MD [facility] Jan 08, 2016 8:57 AM   cc:  CPT CODES: ICD CODES:  The ICD and CPT codes recommended by this software are interpretations from the data that the clinical staff has captured with the software.  The verification of the translation of this report to the ICD and CPT codes and modifiers is the sole responsibility of the health care institution and practicing physician where this report was generated.  [employer]. will not be held responsible for the validity of the ICD and CPT codes included on this report.  AMA assumes no liability for data contained or not contained herein. CPT is a Designer, television/film set of the [employer].

## 2015-12-19 NOTE — Anesthesia Postprocedure Evaluation (Signed)
Anesthesia Post Note  Patient: Stephanie [name]  Procedure(s) Performed: Procedure(s) (LRB): COLONOSCOPY WITH PROPOFOL (N/A)  Patient location during evaluation: PACU Anesthesia Type: MAC Level of consciousness: awake and alert Pain management: pain level controlled Vital Signs Assessment: post-procedure vital signs reviewed and stable Respiratory status: spontaneous breathing Cardiovascular status: stable Anesthetic complications: no Comments: Late entry 12/19/2015 0734 T. Demarious Kapur CRNA    Last Vitals:  Filed Vitals:   12/18/15 0930 12/18/15 0943  BP: 111/70 131/83  Pulse: 87 91  Temp:  36.4 C  Resp: 14 18    Last Pain: There were no vitals filed for this visit.               Drucie Opitz

## 2015-12-20 ENCOUNTER — Encounter (HOSPITAL_COMMUNITY): Payer: Self-pay | Admitting: Internal Medicine

## 2016-01-09 DIAGNOSIS — R269 Unspecified abnormalities of gait and mobility: Secondary | ICD-10-CM | POA: Diagnosis not present

## 2016-01-09 DIAGNOSIS — G3184 Mild cognitive impairment, so stated: Secondary | ICD-10-CM | POA: Diagnosis not present

## 2016-01-09 DIAGNOSIS — G43C1 Periodic headache syndromes in child or adult, intractable: Secondary | ICD-10-CM | POA: Diagnosis not present

## 2016-01-09 DIAGNOSIS — R4182 Altered mental status, unspecified: Secondary | ICD-10-CM | POA: Diagnosis not present

## 2016-01-09 DIAGNOSIS — F5101 Primary insomnia: Secondary | ICD-10-CM | POA: Diagnosis not present

## 2016-01-25 DIAGNOSIS — R569 Unspecified convulsions: Secondary | ICD-10-CM | POA: Diagnosis not present

## 2016-02-19 ENCOUNTER — Encounter: Payer: Self-pay | Admitting: Nurse Practitioner

## 2016-02-19 ENCOUNTER — Ambulatory Visit: Payer: Self-pay | Admitting: Nurse Practitioner

## 2016-02-19 ENCOUNTER — Telehealth: Payer: Self-pay | Admitting: Nurse Practitioner

## 2016-02-19 NOTE — Telephone Encounter (Signed)
Patient called and rescheduled the appointment that was scheduled for today, however if she no shows for her next appointment, please route to the primary GI doctor for dismissal.

## 2016-02-19 NOTE — Telephone Encounter (Signed)
Noted. Will forward to [place].

## 2016-02-19 NOTE — Telephone Encounter (Signed)
PATIENT WAS A NO SHOW FOR 5TH TIME

## 2016-03-05 DIAGNOSIS — E782 Mixed hyperlipidemia: Secondary | ICD-10-CM | POA: Diagnosis not present

## 2016-03-05 DIAGNOSIS — I10 Essential (primary) hypertension: Secondary | ICD-10-CM | POA: Diagnosis not present

## 2016-03-05 DIAGNOSIS — Z6826 Body mass index (BMI) 26.0-26.9, adult: Secondary | ICD-10-CM | POA: Diagnosis not present

## 2016-03-05 DIAGNOSIS — Z1389 Encounter for screening for other disorder: Secondary | ICD-10-CM | POA: Diagnosis not present

## 2016-03-05 DIAGNOSIS — G894 Chronic pain syndrome: Secondary | ICD-10-CM | POA: Diagnosis not present

## 2016-03-05 DIAGNOSIS — M1991 Primary osteoarthritis, unspecified site: Secondary | ICD-10-CM | POA: Diagnosis not present

## 2016-03-05 DIAGNOSIS — G459 Transient cerebral ischemic attack, unspecified: Secondary | ICD-10-CM | POA: Diagnosis not present

## 2016-03-05 DIAGNOSIS — R269 Unspecified abnormalities of gait and mobility: Secondary | ICD-10-CM | POA: Diagnosis not present

## 2016-03-07 ENCOUNTER — Ambulatory Visit: Payer: Self-pay | Admitting: Nurse Practitioner

## 2016-03-11 ENCOUNTER — Ambulatory Visit (INDEPENDENT_AMBULATORY_CARE_PROVIDER_SITE_OTHER): Payer: Medicare Other | Admitting: Nurse Practitioner

## 2016-03-11 ENCOUNTER — Encounter: Payer: Self-pay | Admitting: Nurse Practitioner

## 2016-03-11 VITALS — BP 144/83 | HR 98 | Temp 97.9°F | Ht 62.0 in | Wt 148.8 lb

## 2016-03-11 DIAGNOSIS — K625 Hemorrhage of anus and rectum: Secondary | ICD-10-CM

## 2016-03-11 DIAGNOSIS — K649 Unspecified hemorrhoids: Secondary | ICD-10-CM | POA: Diagnosis not present

## 2016-03-11 DIAGNOSIS — K5909 Other constipation: Secondary | ICD-10-CM | POA: Diagnosis not present

## 2016-03-11 NOTE — Progress Notes (Signed)
CC'ED TO PCP 

## 2016-03-11 NOTE — Patient Instructions (Signed)
1. He should be taking Amitiza (Lubiprostone) 25 mcg. Take this twice a day, with meals (as you have been.) 2. If this is not the medication you have at home, call our office so we can send in the correct medication. 3. We will schedule a follow-up visit for hemorrhoid banding

## 2016-03-11 NOTE — Assessment & Plan Note (Signed)
Recent colonoscopy noted internal hemorrhoids as source of hematochezia. Given her poorly control constipation this is likely source of continued occasional scant toilet tissue hematochezia. Hopefully with correction of her medication confusion this will improve her symptoms. We will also schedule her for hemorrhoid banding as she is interested.

## 2016-03-11 NOTE — Assessment & Plan Note (Signed)
Patient with a long-standing history of constipation. It seems she is quite confused about her medications. Likely she is taking Amitiza 24 g once a day but thinks she's taking Linzess. Linzess as previously failed and she should be on Amitiza 24 g twice daily with food. I provided clarification to her both in writing and verbally. This could be a result of her poorly control constipation. Has intermittent diarrhea after she has a hard stool which is likely overflow. Hopefully with the current dosing on the correct medication her symptoms will improve. Return for follow-up and hemorrhoid banding.

## 2016-03-11 NOTE — Assessment & Plan Note (Signed)
Internal hemorrhoids as likely source of toilet tissue hematochezia as per note on last colonoscopy. The patient continues to have symptoms and is interested in hemorrhoid banding. Impression from recently done colonoscopy indicates to consider outpatient banding if continued symptoms. We will set her up for follow-up visit for banding.

## 2016-03-11 NOTE — Progress Notes (Signed)
Referring Provider: Redmond School, MD Primary Care Physician:  Glo Herring., MD Primary GI:  Dr. Gala Romney  Chief Complaint  Patient presents with  . Constipation  . Rectal Bleeding    HPI:   Stephanie [name] is a 60 y.o. female who presents For follow-up on constipation and rectal bleeding. Last seen in our office 11/21/2015 for the same. At that time she was noted to be a difficult historian but stated she tried Linzess for 2-3 weeks which did not work. At that time she was having a bowel movement about once a week with bloating, cramping abdominal pain which improves after bowel movement. Also toilet tissue hematochezia. Last colonoscopy 12/18/2015 for change in bowel habits which noted anal canal/internal hemorrhoids likely source of hematochezia, otherwise normal colonoscopy. Recommended continue Amitiza 24 g twice daily, follow-up visit in 4-6 weeks. Consider outpatient banding if continues to have hemorrhoid issues.  Today she states her constipation waxes and wanes, will have hard stools one day and runny/watery stools the next. States she is currently on Linzess, but when asked if she's taking 145 or 290 mcg stated "it's not that high" even though those are the only does made in [place]. Cannot remember if she's on Amitiza but states her medication is taken after eating half her meal which is typical dosing for Amitiza, but is onl taking it once a day. Also with abdominal pain which improves after a bowel movement. Also with a lot of gas. Continued some toilet tissue hematochezia as well as occasionally rectal pain/irritation. She is interested in hemorrhoid banding. Denies chest pain, dyspnea, dizziness, lightheadedness, syncope, near syncope. Denies any other upper or lower GI symptoms.  Past Medical History  Diagnosis Date  . Depression   . Anxiety   . Hypertension   . Hypercholesterolemia   . GERD (gastroesophageal reflux disease)   . Chronic pain   . Chronic neck and back  pain     Past Surgical History  Procedure Laterality Date  . Neck surgery    . Abdominal hysterectomy    . Cholecystectomy    . Appendectomy    . Esophagogastroduodenoscopy  2009    Dr. Gala Romney: normal, small hiatal hernia  . Colonoscopy  2009    Dr. Gala Romney: normal  . Hernia repair      umbilical  . Colonoscopy with propofol N/A 12/18/2015    [id number] canal/internal hemorrhoids otherwise normal    Current Outpatient Prescriptions  Medication Sig Dispense Refill  . buPROPion (WELLBUTRIN) 100 MG tablet Take 200 mg by mouth daily.     . cyclobenzaprine (FLEXERIL) 10 MG tablet Take 1 tablet by mouth 2 (two) times daily.    Marland Kitchen dexlansoprazole (DEXILANT) 60 MG capsule Take 60 mg by mouth 2 (two) times daily.    . diazepam (VALIUM) 10 MG tablet Take 10 mg by mouth 3 (three) times daily as needed for anxiety.     . docusate sodium (COLACE) 100 MG capsule Take 100 mg by mouth 2 (two) times daily.    . hydrochlorothiazide (HYDRODIURIL) 25 MG tablet Take 25 mg by mouth daily.    Marland Kitchen ibuprofen (ADVIL,MOTRIN) 800 MG tablet Take 800 mg by mouth every 8 (eight) hours as needed.   0  . lisinopril (PRINIVIL,ZESTRIL) 10 MG tablet Take 10 mg by mouth 2 (two) times daily.     . mirtazapine (REMERON) 15 MG tablet Take 15 mg by mouth at bedtime.    . pregabalin (LYRICA) 50 MG capsule Take 50 mg  by mouth 3 (three) times daily as needed.    . simvastatin (ZOCOR) 10 MG tablet Take 10 mg by mouth daily.    . tapentadol (NUCYNTA) 50 MG TABS tablet Take 1 tablet by mouth every 8 (eight) hours as needed for moderate pain.      No current facility-administered medications for this visit.    Allergies as of 03/11/2016 - Review Complete 03/11/2016  Allergen Reaction Noted  . Prednisone Anaphylaxis 09/19/2014    Family History  Problem Relation Age of Onset  . Parkinson's disease Father     Deceased  . Brain cancer Mother     Deceased  . Colon cancer Father     diagnosed in his 42s     Social History    Social History  . Marital Status: Married    Spouse Name: N/A  . Number of Children: N/A  . Years of Education: N/A   Social History Main Topics  . Smoking status: Never Smoker   . Smokeless tobacco: None     Comment: Never smoked  . Alcohol Use: 0.0 oz/week    0 Standard drinks or equivalent per week     Comment: social alcohol   . Drug Use: No  . Sexual Activity: Yes    Birth Control/ Protection: Surgical   Other Topics Concern  . None   Social History Narrative    Review of Systems: General: Negative for anorexia, weight loss, fever, chills, fatigue, weakness. CV: Negative for chest pain, angina, palpitations, peripheral edema.  Respiratory: Negative for dyspnea at rest, cough, sputum, wheezing.  GI: See history of present illness. Endo: Negative for unusual weight change.    Physical Exam: BP 144/83 mmHg  Pulse 98  Temp(Src) 97.9 F (36.6 C) (Oral)  Ht 5\' 2"  (1.575 m)  Wt 148 lb 12.8 oz (67.495 kg)  BMI 27.21 kg/m2 General:   Alert and oriented. Pleasant and cooperative. Well-nourished and well-developed.  Eyes:  Without icterus, sclera clear and conjunctiva pink.  Ears:  Normal auditory acuity. Cardiovascular:  S1, S2 present without murmurs appreciated. Extremities without clubbing or edema. Respiratory:  Clear to auscultation bilaterally. No wheezes, rales, or rhonchi. No distress.  Gastrointestinal:  +BS, soft, and non-distended. Generalized TTP. No HSM noted. No guarding or rebound. No masses appreciated.  Rectal:  Deferred  Musculoskalatal:  Symmetrical without gross deformities. Neurologic:  Alert and oriented x4;  grossly normal neurologically. Psych:  Alert and cooperative. Normal mood and affect. Heme/Lymph/Immune: No excessive bruising noted.    03/11/2016 12:15 PM   Disclaimer: This note was dictated with voice recognition software. Similar sounding words can inadvertently be transcribed and may not be corrected upon review.

## 2016-03-14 DIAGNOSIS — E538 Deficiency of other specified B group vitamins: Secondary | ICD-10-CM | POA: Diagnosis not present

## 2016-03-14 DIAGNOSIS — E559 Vitamin D deficiency, unspecified: Secondary | ICD-10-CM | POA: Diagnosis not present

## 2016-03-14 DIAGNOSIS — R4182 Altered mental status, unspecified: Secondary | ICD-10-CM | POA: Diagnosis not present

## 2016-03-14 DIAGNOSIS — R569 Unspecified convulsions: Secondary | ICD-10-CM | POA: Diagnosis not present

## 2016-03-14 DIAGNOSIS — R5383 Other fatigue: Secondary | ICD-10-CM | POA: Diagnosis not present

## 2016-03-14 DIAGNOSIS — R269 Unspecified abnormalities of gait and mobility: Secondary | ICD-10-CM | POA: Diagnosis not present

## 2016-03-14 DIAGNOSIS — F5101 Primary insomnia: Secondary | ICD-10-CM | POA: Diagnosis not present

## 2016-03-14 DIAGNOSIS — Z79899 Other long term (current) drug therapy: Secondary | ICD-10-CM | POA: Diagnosis not present

## 2016-03-14 DIAGNOSIS — G3184 Mild cognitive impairment, so stated: Secondary | ICD-10-CM | POA: Diagnosis not present

## 2016-03-14 DIAGNOSIS — M818 Other osteoporosis without current pathological fracture: Secondary | ICD-10-CM | POA: Diagnosis not present

## 2016-03-14 DIAGNOSIS — G43C1 Periodic headache syndromes in child or adult, intractable: Secondary | ICD-10-CM | POA: Diagnosis not present

## 2016-03-14 DIAGNOSIS — I951 Orthostatic hypotension: Secondary | ICD-10-CM | POA: Diagnosis not present

## 2016-03-21 DIAGNOSIS — F419 Anxiety disorder, unspecified: Secondary | ICD-10-CM | POA: Diagnosis not present

## 2016-03-21 DIAGNOSIS — G894 Chronic pain syndrome: Secondary | ICD-10-CM | POA: Diagnosis not present

## 2016-03-21 DIAGNOSIS — R269 Unspecified abnormalities of gait and mobility: Secondary | ICD-10-CM | POA: Diagnosis not present

## 2016-03-21 DIAGNOSIS — Z1389 Encounter for screening for other disorder: Secondary | ICD-10-CM | POA: Diagnosis not present

## 2016-03-21 DIAGNOSIS — M1991 Primary osteoarthritis, unspecified site: Secondary | ICD-10-CM | POA: Diagnosis not present

## 2016-03-21 DIAGNOSIS — Z681 Body mass index (BMI) 19 or less, adult: Secondary | ICD-10-CM | POA: Diagnosis not present

## 2016-03-22 ENCOUNTER — Telehealth: Payer: Self-pay | Admitting: Internal Medicine

## 2016-03-22 ENCOUNTER — Encounter: Payer: Self-pay | Admitting: Internal Medicine

## 2016-03-22 NOTE — Telephone Encounter (Signed)
Patient was a no show and letter sent  °

## 2016-04-03 DIAGNOSIS — I951 Orthostatic hypotension: Secondary | ICD-10-CM | POA: Diagnosis not present

## 2016-04-03 DIAGNOSIS — F5101 Primary insomnia: Secondary | ICD-10-CM | POA: Diagnosis not present

## 2016-04-03 DIAGNOSIS — G43C1 Periodic headache syndromes in child or adult, intractable: Secondary | ICD-10-CM | POA: Diagnosis not present

## 2016-04-03 DIAGNOSIS — G3184 Mild cognitive impairment, so stated: Secondary | ICD-10-CM | POA: Diagnosis not present

## 2016-04-03 DIAGNOSIS — R569 Unspecified convulsions: Secondary | ICD-10-CM | POA: Diagnosis not present

## 2016-04-03 DIAGNOSIS — R413 Other amnesia: Secondary | ICD-10-CM | POA: Diagnosis not present

## 2016-04-03 DIAGNOSIS — R4182 Altered mental status, unspecified: Secondary | ICD-10-CM | POA: Diagnosis not present

## 2016-04-03 DIAGNOSIS — R269 Unspecified abnormalities of gait and mobility: Secondary | ICD-10-CM | POA: Diagnosis not present

## 2016-04-03 DIAGNOSIS — R296 Repeated falls: Secondary | ICD-10-CM | POA: Diagnosis not present

## 2016-05-01 DIAGNOSIS — R296 Repeated falls: Secondary | ICD-10-CM | POA: Diagnosis not present

## 2016-05-01 DIAGNOSIS — G43C1 Periodic headache syndromes in child or adult, intractable: Secondary | ICD-10-CM | POA: Diagnosis not present

## 2016-05-01 DIAGNOSIS — R269 Unspecified abnormalities of gait and mobility: Secondary | ICD-10-CM | POA: Diagnosis not present

## 2016-05-01 DIAGNOSIS — I951 Orthostatic hypotension: Secondary | ICD-10-CM | POA: Diagnosis not present

## 2016-05-01 DIAGNOSIS — F5101 Primary insomnia: Secondary | ICD-10-CM | POA: Diagnosis not present

## 2016-05-01 DIAGNOSIS — R413 Other amnesia: Secondary | ICD-10-CM | POA: Diagnosis not present

## 2016-05-01 DIAGNOSIS — R569 Unspecified convulsions: Secondary | ICD-10-CM | POA: Diagnosis not present

## 2016-05-01 DIAGNOSIS — R4182 Altered mental status, unspecified: Secondary | ICD-10-CM | POA: Diagnosis not present

## 2016-05-01 DIAGNOSIS — G3184 Mild cognitive impairment, so stated: Secondary | ICD-10-CM | POA: Diagnosis not present

## 2016-05-10 ENCOUNTER — Encounter: Payer: Self-pay | Admitting: Internal Medicine

## 2016-05-10 ENCOUNTER — Telehealth: Payer: Self-pay | Admitting: Internal Medicine

## 2016-05-10 NOTE — Telephone Encounter (Signed)
PATIENT WAS A NO SHOW AND LETTER SENT  °

## 2016-06-07 DIAGNOSIS — R269 Unspecified abnormalities of gait and mobility: Secondary | ICD-10-CM | POA: Diagnosis not present

## 2016-06-07 DIAGNOSIS — R4182 Altered mental status, unspecified: Secondary | ICD-10-CM | POA: Diagnosis not present

## 2016-06-07 DIAGNOSIS — R413 Other amnesia: Secondary | ICD-10-CM | POA: Diagnosis not present

## 2016-06-07 DIAGNOSIS — G43C1 Periodic headache syndromes in child or adult, intractable: Secondary | ICD-10-CM | POA: Diagnosis not present

## 2016-06-07 DIAGNOSIS — F5101 Primary insomnia: Secondary | ICD-10-CM | POA: Diagnosis not present

## 2016-06-07 DIAGNOSIS — R296 Repeated falls: Secondary | ICD-10-CM | POA: Diagnosis not present

## 2016-06-07 DIAGNOSIS — G3184 Mild cognitive impairment, so stated: Secondary | ICD-10-CM | POA: Diagnosis not present

## 2016-06-07 DIAGNOSIS — I951 Orthostatic hypotension: Secondary | ICD-10-CM | POA: Diagnosis not present

## 2016-06-07 DIAGNOSIS — G4089 Other seizures: Secondary | ICD-10-CM | POA: Diagnosis not present

## 2016-06-20 DIAGNOSIS — R413 Other amnesia: Secondary | ICD-10-CM | POA: Diagnosis not present

## 2016-06-20 DIAGNOSIS — F5101 Primary insomnia: Secondary | ICD-10-CM | POA: Diagnosis not present

## 2016-06-20 DIAGNOSIS — G43C1 Periodic headache syndromes in child or adult, intractable: Secondary | ICD-10-CM | POA: Diagnosis not present

## 2016-06-20 DIAGNOSIS — G4089 Other seizures: Secondary | ICD-10-CM | POA: Diagnosis not present

## 2016-06-20 DIAGNOSIS — I951 Orthostatic hypotension: Secondary | ICD-10-CM | POA: Diagnosis not present

## 2016-06-20 DIAGNOSIS — R296 Repeated falls: Secondary | ICD-10-CM | POA: Diagnosis not present

## 2016-06-20 DIAGNOSIS — I10 Essential (primary) hypertension: Secondary | ICD-10-CM | POA: Diagnosis not present

## 2016-06-20 DIAGNOSIS — Z8673 Personal history of transient ischemic attack (TIA), and cerebral infarction without residual deficits: Secondary | ICD-10-CM | POA: Diagnosis not present

## 2016-06-20 DIAGNOSIS — R4182 Altered mental status, unspecified: Secondary | ICD-10-CM | POA: Diagnosis not present

## 2016-06-20 DIAGNOSIS — Z9181 History of falling: Secondary | ICD-10-CM | POA: Diagnosis not present

## 2016-06-20 DIAGNOSIS — G3184 Mild cognitive impairment, so stated: Secondary | ICD-10-CM | POA: Diagnosis not present

## 2016-06-20 DIAGNOSIS — R269 Unspecified abnormalities of gait and mobility: Secondary | ICD-10-CM | POA: Diagnosis not present

## 2016-06-20 DIAGNOSIS — G40909 Epilepsy, unspecified, not intractable, without status epilepticus: Secondary | ICD-10-CM | POA: Diagnosis not present

## 2016-06-21 DIAGNOSIS — I951 Orthostatic hypotension: Secondary | ICD-10-CM | POA: Diagnosis not present

## 2016-06-21 DIAGNOSIS — G40909 Epilepsy, unspecified, not intractable, without status epilepticus: Secondary | ICD-10-CM | POA: Diagnosis not present

## 2016-06-21 DIAGNOSIS — Z9181 History of falling: Secondary | ICD-10-CM | POA: Diagnosis not present

## 2016-06-21 DIAGNOSIS — I10 Essential (primary) hypertension: Secondary | ICD-10-CM | POA: Diagnosis not present

## 2016-06-21 DIAGNOSIS — G3184 Mild cognitive impairment, so stated: Secondary | ICD-10-CM | POA: Diagnosis not present

## 2016-06-21 DIAGNOSIS — R413 Other amnesia: Secondary | ICD-10-CM | POA: Diagnosis not present

## 2016-06-24 DIAGNOSIS — G3184 Mild cognitive impairment, so stated: Secondary | ICD-10-CM | POA: Diagnosis not present

## 2016-06-24 DIAGNOSIS — Z9181 History of falling: Secondary | ICD-10-CM | POA: Diagnosis not present

## 2016-06-24 DIAGNOSIS — I10 Essential (primary) hypertension: Secondary | ICD-10-CM | POA: Diagnosis not present

## 2016-06-24 DIAGNOSIS — I951 Orthostatic hypotension: Secondary | ICD-10-CM | POA: Diagnosis not present

## 2016-06-24 DIAGNOSIS — G40909 Epilepsy, unspecified, not intractable, without status epilepticus: Secondary | ICD-10-CM | POA: Diagnosis not present

## 2016-06-24 DIAGNOSIS — R413 Other amnesia: Secondary | ICD-10-CM | POA: Diagnosis not present

## 2016-06-27 DIAGNOSIS — R413 Other amnesia: Secondary | ICD-10-CM | POA: Diagnosis not present

## 2016-06-27 DIAGNOSIS — G40909 Epilepsy, unspecified, not intractable, without status epilepticus: Secondary | ICD-10-CM | POA: Diagnosis not present

## 2016-06-27 DIAGNOSIS — I10 Essential (primary) hypertension: Secondary | ICD-10-CM | POA: Diagnosis not present

## 2016-06-27 DIAGNOSIS — I951 Orthostatic hypotension: Secondary | ICD-10-CM | POA: Diagnosis not present

## 2016-06-27 DIAGNOSIS — G3184 Mild cognitive impairment, so stated: Secondary | ICD-10-CM | POA: Diagnosis not present

## 2016-06-27 DIAGNOSIS — Z9181 History of falling: Secondary | ICD-10-CM | POA: Diagnosis not present

## 2016-06-28 DIAGNOSIS — I10 Essential (primary) hypertension: Secondary | ICD-10-CM | POA: Diagnosis not present

## 2016-06-28 DIAGNOSIS — R413 Other amnesia: Secondary | ICD-10-CM | POA: Diagnosis not present

## 2016-06-28 DIAGNOSIS — G3184 Mild cognitive impairment, so stated: Secondary | ICD-10-CM | POA: Diagnosis not present

## 2016-06-28 DIAGNOSIS — Z9181 History of falling: Secondary | ICD-10-CM | POA: Diagnosis not present

## 2016-06-28 DIAGNOSIS — G40909 Epilepsy, unspecified, not intractable, without status epilepticus: Secondary | ICD-10-CM | POA: Diagnosis not present

## 2016-06-28 DIAGNOSIS — I951 Orthostatic hypotension: Secondary | ICD-10-CM | POA: Diagnosis not present

## 2016-07-02 DIAGNOSIS — Z9181 History of falling: Secondary | ICD-10-CM | POA: Diagnosis not present

## 2016-07-02 DIAGNOSIS — I951 Orthostatic hypotension: Secondary | ICD-10-CM | POA: Diagnosis not present

## 2016-07-02 DIAGNOSIS — G3184 Mild cognitive impairment, so stated: Secondary | ICD-10-CM | POA: Diagnosis not present

## 2016-07-02 DIAGNOSIS — I10 Essential (primary) hypertension: Secondary | ICD-10-CM | POA: Diagnosis not present

## 2016-07-02 DIAGNOSIS — R413 Other amnesia: Secondary | ICD-10-CM | POA: Diagnosis not present

## 2016-07-02 DIAGNOSIS — G40909 Epilepsy, unspecified, not intractable, without status epilepticus: Secondary | ICD-10-CM | POA: Diagnosis not present

## 2016-07-03 DIAGNOSIS — G40909 Epilepsy, unspecified, not intractable, without status epilepticus: Secondary | ICD-10-CM | POA: Diagnosis not present

## 2016-07-03 DIAGNOSIS — G3184 Mild cognitive impairment, so stated: Secondary | ICD-10-CM | POA: Diagnosis not present

## 2016-07-03 DIAGNOSIS — I10 Essential (primary) hypertension: Secondary | ICD-10-CM | POA: Diagnosis not present

## 2016-07-03 DIAGNOSIS — Z9181 History of falling: Secondary | ICD-10-CM | POA: Diagnosis not present

## 2016-07-03 DIAGNOSIS — R413 Other amnesia: Secondary | ICD-10-CM | POA: Diagnosis not present

## 2016-07-03 DIAGNOSIS — I951 Orthostatic hypotension: Secondary | ICD-10-CM | POA: Diagnosis not present

## 2016-07-04 DIAGNOSIS — R413 Other amnesia: Secondary | ICD-10-CM | POA: Diagnosis not present

## 2016-07-04 DIAGNOSIS — G3184 Mild cognitive impairment, so stated: Secondary | ICD-10-CM | POA: Diagnosis not present

## 2016-07-04 DIAGNOSIS — Z9181 History of falling: Secondary | ICD-10-CM | POA: Diagnosis not present

## 2016-07-04 DIAGNOSIS — I10 Essential (primary) hypertension: Secondary | ICD-10-CM | POA: Diagnosis not present

## 2016-07-04 DIAGNOSIS — I951 Orthostatic hypotension: Secondary | ICD-10-CM | POA: Diagnosis not present

## 2016-07-04 DIAGNOSIS — G40909 Epilepsy, unspecified, not intractable, without status epilepticus: Secondary | ICD-10-CM | POA: Diagnosis not present

## 2016-07-05 DIAGNOSIS — R296 Repeated falls: Secondary | ICD-10-CM | POA: Diagnosis not present

## 2016-07-05 DIAGNOSIS — G40909 Epilepsy, unspecified, not intractable, without status epilepticus: Secondary | ICD-10-CM | POA: Diagnosis not present

## 2016-07-05 DIAGNOSIS — Z9181 History of falling: Secondary | ICD-10-CM | POA: Diagnosis not present

## 2016-07-05 DIAGNOSIS — I10 Essential (primary) hypertension: Secondary | ICD-10-CM | POA: Diagnosis not present

## 2016-07-05 DIAGNOSIS — G3184 Mild cognitive impairment, so stated: Secondary | ICD-10-CM | POA: Diagnosis not present

## 2016-07-05 DIAGNOSIS — G40019 Localization-related (focal) (partial) idiopathic epilepsy and epileptic syndromes with seizures of localized onset, intractable, without status epilepticus: Secondary | ICD-10-CM | POA: Diagnosis not present

## 2016-07-05 DIAGNOSIS — R269 Unspecified abnormalities of gait and mobility: Secondary | ICD-10-CM | POA: Diagnosis not present

## 2016-07-05 DIAGNOSIS — F5101 Primary insomnia: Secondary | ICD-10-CM | POA: Diagnosis not present

## 2016-07-05 DIAGNOSIS — R4182 Altered mental status, unspecified: Secondary | ICD-10-CM | POA: Diagnosis not present

## 2016-07-05 DIAGNOSIS — I951 Orthostatic hypotension: Secondary | ICD-10-CM | POA: Diagnosis not present

## 2016-07-05 DIAGNOSIS — G43C1 Periodic headache syndromes in child or adult, intractable: Secondary | ICD-10-CM | POA: Diagnosis not present

## 2016-07-05 DIAGNOSIS — R413 Other amnesia: Secondary | ICD-10-CM | POA: Diagnosis not present

## 2016-07-08 DIAGNOSIS — G40909 Epilepsy, unspecified, not intractable, without status epilepticus: Secondary | ICD-10-CM | POA: Diagnosis not present

## 2016-07-08 DIAGNOSIS — G3184 Mild cognitive impairment, so stated: Secondary | ICD-10-CM | POA: Diagnosis not present

## 2016-07-08 DIAGNOSIS — R413 Other amnesia: Secondary | ICD-10-CM | POA: Diagnosis not present

## 2016-07-08 DIAGNOSIS — I951 Orthostatic hypotension: Secondary | ICD-10-CM | POA: Diagnosis not present

## 2016-07-08 DIAGNOSIS — Z9181 History of falling: Secondary | ICD-10-CM | POA: Diagnosis not present

## 2016-07-08 DIAGNOSIS — I10 Essential (primary) hypertension: Secondary | ICD-10-CM | POA: Diagnosis not present

## 2016-07-09 ENCOUNTER — Encounter: Payer: Self-pay | Admitting: Internal Medicine

## 2016-07-09 ENCOUNTER — Telehealth: Payer: Self-pay | Admitting: Internal Medicine

## 2016-07-09 DIAGNOSIS — G3184 Mild cognitive impairment, so stated: Secondary | ICD-10-CM | POA: Diagnosis not present

## 2016-07-09 DIAGNOSIS — Z9181 History of falling: Secondary | ICD-10-CM | POA: Diagnosis not present

## 2016-07-09 DIAGNOSIS — R413 Other amnesia: Secondary | ICD-10-CM | POA: Diagnosis not present

## 2016-07-09 DIAGNOSIS — I951 Orthostatic hypotension: Secondary | ICD-10-CM | POA: Diagnosis not present

## 2016-07-09 DIAGNOSIS — I10 Essential (primary) hypertension: Secondary | ICD-10-CM | POA: Diagnosis not present

## 2016-07-09 DIAGNOSIS — G40909 Epilepsy, unspecified, not intractable, without status epilepticus: Secondary | ICD-10-CM | POA: Diagnosis not present

## 2016-07-09 NOTE — Telephone Encounter (Signed)
PATIENT WAS A NO SHOW (8 TIMES)

## 2016-07-11 DIAGNOSIS — Z9181 History of falling: Secondary | ICD-10-CM | POA: Diagnosis not present

## 2016-07-11 DIAGNOSIS — G3184 Mild cognitive impairment, so stated: Secondary | ICD-10-CM | POA: Diagnosis not present

## 2016-07-11 DIAGNOSIS — I10 Essential (primary) hypertension: Secondary | ICD-10-CM | POA: Diagnosis not present

## 2016-07-11 DIAGNOSIS — R413 Other amnesia: Secondary | ICD-10-CM | POA: Diagnosis not present

## 2016-07-11 DIAGNOSIS — I951 Orthostatic hypotension: Secondary | ICD-10-CM | POA: Diagnosis not present

## 2016-07-11 DIAGNOSIS — G40909 Epilepsy, unspecified, not intractable, without status epilepticus: Secondary | ICD-10-CM | POA: Diagnosis not present

## 2016-07-12 DIAGNOSIS — I10 Essential (primary) hypertension: Secondary | ICD-10-CM | POA: Diagnosis not present

## 2016-07-12 DIAGNOSIS — G40909 Epilepsy, unspecified, not intractable, without status epilepticus: Secondary | ICD-10-CM | POA: Diagnosis not present

## 2016-07-12 DIAGNOSIS — R413 Other amnesia: Secondary | ICD-10-CM | POA: Diagnosis not present

## 2016-07-12 DIAGNOSIS — I951 Orthostatic hypotension: Secondary | ICD-10-CM | POA: Diagnosis not present

## 2016-07-12 DIAGNOSIS — Z9181 History of falling: Secondary | ICD-10-CM | POA: Diagnosis not present

## 2016-07-12 DIAGNOSIS — G3184 Mild cognitive impairment, so stated: Secondary | ICD-10-CM | POA: Diagnosis not present

## 2016-07-16 DIAGNOSIS — I951 Orthostatic hypotension: Secondary | ICD-10-CM | POA: Diagnosis not present

## 2016-07-16 DIAGNOSIS — I10 Essential (primary) hypertension: Secondary | ICD-10-CM | POA: Diagnosis not present

## 2016-07-16 DIAGNOSIS — Z9181 History of falling: Secondary | ICD-10-CM | POA: Diagnosis not present

## 2016-07-16 DIAGNOSIS — G40909 Epilepsy, unspecified, not intractable, without status epilepticus: Secondary | ICD-10-CM | POA: Diagnosis not present

## 2016-07-16 DIAGNOSIS — R413 Other amnesia: Secondary | ICD-10-CM | POA: Diagnosis not present

## 2016-07-16 DIAGNOSIS — G3184 Mild cognitive impairment, so stated: Secondary | ICD-10-CM | POA: Diagnosis not present

## 2016-07-17 DIAGNOSIS — R413 Other amnesia: Secondary | ICD-10-CM | POA: Diagnosis not present

## 2016-07-17 DIAGNOSIS — Z9181 History of falling: Secondary | ICD-10-CM | POA: Diagnosis not present

## 2016-07-17 DIAGNOSIS — I951 Orthostatic hypotension: Secondary | ICD-10-CM | POA: Diagnosis not present

## 2016-07-17 DIAGNOSIS — I10 Essential (primary) hypertension: Secondary | ICD-10-CM | POA: Diagnosis not present

## 2016-07-17 DIAGNOSIS — G3184 Mild cognitive impairment, so stated: Secondary | ICD-10-CM | POA: Diagnosis not present

## 2016-07-17 DIAGNOSIS — G40909 Epilepsy, unspecified, not intractable, without status epilepticus: Secondary | ICD-10-CM | POA: Diagnosis not present

## 2016-07-19 DIAGNOSIS — I951 Orthostatic hypotension: Secondary | ICD-10-CM | POA: Diagnosis not present

## 2016-07-19 DIAGNOSIS — Z9181 History of falling: Secondary | ICD-10-CM | POA: Diagnosis not present

## 2016-07-19 DIAGNOSIS — G3184 Mild cognitive impairment, so stated: Secondary | ICD-10-CM | POA: Diagnosis not present

## 2016-07-19 DIAGNOSIS — I10 Essential (primary) hypertension: Secondary | ICD-10-CM | POA: Diagnosis not present

## 2016-07-19 DIAGNOSIS — R413 Other amnesia: Secondary | ICD-10-CM | POA: Diagnosis not present

## 2016-07-19 DIAGNOSIS — G40909 Epilepsy, unspecified, not intractable, without status epilepticus: Secondary | ICD-10-CM | POA: Diagnosis not present

## 2016-07-22 DIAGNOSIS — I951 Orthostatic hypotension: Secondary | ICD-10-CM | POA: Diagnosis not present

## 2016-07-22 DIAGNOSIS — G40909 Epilepsy, unspecified, not intractable, without status epilepticus: Secondary | ICD-10-CM | POA: Diagnosis not present

## 2016-07-22 DIAGNOSIS — R413 Other amnesia: Secondary | ICD-10-CM | POA: Diagnosis not present

## 2016-07-22 DIAGNOSIS — G3184 Mild cognitive impairment, so stated: Secondary | ICD-10-CM | POA: Diagnosis not present

## 2016-07-22 DIAGNOSIS — I10 Essential (primary) hypertension: Secondary | ICD-10-CM | POA: Diagnosis not present

## 2016-07-22 DIAGNOSIS — Z9181 History of falling: Secondary | ICD-10-CM | POA: Diagnosis not present

## 2016-07-24 DIAGNOSIS — G40909 Epilepsy, unspecified, not intractable, without status epilepticus: Secondary | ICD-10-CM | POA: Diagnosis not present

## 2016-07-24 DIAGNOSIS — Z9181 History of falling: Secondary | ICD-10-CM | POA: Diagnosis not present

## 2016-07-24 DIAGNOSIS — I951 Orthostatic hypotension: Secondary | ICD-10-CM | POA: Diagnosis not present

## 2016-07-24 DIAGNOSIS — G3184 Mild cognitive impairment, so stated: Secondary | ICD-10-CM | POA: Diagnosis not present

## 2016-07-24 DIAGNOSIS — R413 Other amnesia: Secondary | ICD-10-CM | POA: Diagnosis not present

## 2016-07-24 DIAGNOSIS — I10 Essential (primary) hypertension: Secondary | ICD-10-CM | POA: Diagnosis not present

## 2016-08-22 DIAGNOSIS — D225 Melanocytic nevi of trunk: Secondary | ICD-10-CM | POA: Diagnosis not present

## 2016-08-22 DIAGNOSIS — L400 Psoriasis vulgaris: Secondary | ICD-10-CM | POA: Diagnosis not present

## 2016-09-09 ENCOUNTER — Telehealth: Payer: Self-pay | Admitting: Family Medicine

## 2016-09-09 NOTE — Telephone Encounter (Signed)
Lm to set up new pt apt with Dr. Meda Coffee

## 2016-10-04 DIAGNOSIS — I951 Orthostatic hypotension: Secondary | ICD-10-CM | POA: Diagnosis not present

## 2016-10-04 DIAGNOSIS — R296 Repeated falls: Secondary | ICD-10-CM | POA: Diagnosis not present

## 2016-10-04 DIAGNOSIS — R4182 Altered mental status, unspecified: Secondary | ICD-10-CM | POA: Diagnosis not present

## 2016-10-04 DIAGNOSIS — G40019 Localization-related (focal) (partial) idiopathic epilepsy and epileptic syndromes with seizures of localized onset, intractable, without status epilepticus: Secondary | ICD-10-CM | POA: Diagnosis not present

## 2016-10-04 DIAGNOSIS — F5101 Primary insomnia: Secondary | ICD-10-CM | POA: Diagnosis not present

## 2016-10-04 DIAGNOSIS — R413 Other amnesia: Secondary | ICD-10-CM | POA: Diagnosis not present

## 2016-10-04 DIAGNOSIS — R269 Unspecified abnormalities of gait and mobility: Secondary | ICD-10-CM | POA: Diagnosis not present

## 2016-10-04 DIAGNOSIS — G3184 Mild cognitive impairment, so stated: Secondary | ICD-10-CM | POA: Diagnosis not present

## 2016-10-04 DIAGNOSIS — G43C1 Periodic headache syndromes in child or adult, intractable: Secondary | ICD-10-CM | POA: Diagnosis not present

## 2016-10-14 ENCOUNTER — Ambulatory Visit (INDEPENDENT_AMBULATORY_CARE_PROVIDER_SITE_OTHER): Payer: Medicare Other | Admitting: Family Medicine

## 2016-10-14 ENCOUNTER — Encounter: Payer: Self-pay | Admitting: Family Medicine

## 2016-10-14 VITALS — BP 124/80 | HR 120 | Resp 16 | Ht 62.0 in | Wt 155.0 lb

## 2016-10-14 DIAGNOSIS — E559 Vitamin D deficiency, unspecified: Secondary | ICD-10-CM

## 2016-10-14 DIAGNOSIS — M5030 Other cervical disc degeneration, unspecified cervical region: Secondary | ICD-10-CM | POA: Diagnosis not present

## 2016-10-14 DIAGNOSIS — Z7689 Persons encountering health services in other specified circumstances: Secondary | ICD-10-CM

## 2016-10-14 DIAGNOSIS — E785 Hyperlipidemia, unspecified: Secondary | ICD-10-CM

## 2016-10-14 DIAGNOSIS — G894 Chronic pain syndrome: Secondary | ICD-10-CM | POA: Insufficient documentation

## 2016-10-14 DIAGNOSIS — Z23 Encounter for immunization: Secondary | ICD-10-CM | POA: Diagnosis not present

## 2016-10-14 DIAGNOSIS — F411 Generalized anxiety disorder: Secondary | ICD-10-CM | POA: Diagnosis not present

## 2016-10-14 MED ORDER — PAROXETINE HCL 10 MG PO TABS: 10.0000 mg | ORAL_TABLET | Freq: Every day | ORAL | 1 refills | Status: DC

## 2016-10-14 MED ORDER — AMLODIPINE BESYLATE 5 MG PO TABS
5.0000 mg | ORAL_TABLET | Freq: Every day | ORAL | 3 refills | Status: DC
Start: 2016-10-14 — End: 2024-03-03

## 2016-10-14 NOTE — Patient Instructions (Addendum)
1.  Need records Dr Gerarda Fraction 2. Need blood tests today 3. STOP lisinopril, hydrochlorothiazide, lasix and valium 4. Take tramadol primarily for pain.  May take 1 or 2 tabs every 6 hours up to 8 maximum a day 5. REDUCE the xanax to twice a day. (alprazolam) 6. Try NOT to take the narcotic hydrocodone unless pain Is severe 7. New medicines: amlodipine once day for blood pressure, paroxetine once a day for anxiety  See me in 2 weeks Call sooner for problems

## 2016-10-14 NOTE — Progress Notes (Signed)
Chief Complaint  Patient presents with  . Establish Care    c/o having a constant ringing in her ears.  . Stress    has been feeling overwhelmed as her husband is terminally ill  . Fall    suffers from pain in her neck and back when she tries to housework or anything physical   Patient is here new to establish I have limited old records, nothing from her primary care doctor She is here with multiple complaints, mostly chronic pain Her other big issue is chronic anxiety She has a history of hypertension and is on lisinopril her blood pressure appears to be well controlled She has multiple medications that she is unable to describe to me what she is taking them for how she takes them, although she brings quite a large bag of pills. She is on both Lasix and hydrochlorothiazide. She doesn't know why she takes either of them. Her last renal function was measured in January 2017. Her creatinine was elevated and her GFR was 35. Going to take her off of the lisinopril and both diuretics until I can do another metabolic panel She has chronic anxiety. She takes mirtazapine 45 mg at night for "sleep". She is also on alprazolam .5 mg 3 times a day, and Valium 10 mg by mouth 3 times a day. She states these are for "my nerves". She seems unaware that these are both similar medications and that she is taking Xanax, although I pointed out to her that this is written on the pill bottle. She has tramadol 50 mg to take 4 times a day for pain. She also has hydrocodone 10 mg take 4 times a day for pain. She states she takes them "when I need them". She gets 120 of each of these a month, and states they last longer than a month. Her hydrocodone pill bottle was given to her 08/26/2016 and it appears she has about 30 pills left. She states she sometimes takes as few as one a day. Her outside records for that she also has prescriptions for Wellbutrin, Nucynta, Lyrica and gabapentin. She doesn't recall having  prescriptions for any of these medications. She states her pain is predominantly in her neck and she has significant cervical degenerative disc disease, she had fusion neck surgery 10 years ago.  Patient Active Problem List   Diagnosis Date Noted  . Generalized anxiety disorder 10/14/2016  . Chronic pain syndrome 10/14/2016  . Degenerative disc disease, cervical 10/14/2016  . Hemorrhoid   . Rectal bleeding 11/21/2015  . Constipation 09/20/2015  . Chest pain 09/19/2014  . HTN (hypertension) 09/19/2014  . Hyperlipidemia 09/19/2014  . Obesity 09/19/2014  . Radicular low back pain 03/21/2014  . Leg weakness, bilateral 03/21/2014    Outpatient Encounter Prescriptions as of 10/14/2016  Medication Sig  . ALPRAZolam (XANAX) 0.5 MG tablet Take 0.5 mg by mouth 2 (two) times daily as needed for anxiety (take as needed).   . cyclobenzaprine (FLEXERIL) 10 MG tablet Take 1 tablet by mouth 2 (two) times daily.  Marland Kitchen dexlansoprazole (DEXILANT) 60 MG capsule Take 60 mg by mouth 2 (two) times daily.  Marland Kitchen docusate sodium (COLACE) 100 MG capsule Take 100 mg by mouth 2 (two) times daily.  . mirtazapine (REMERON) 15 MG tablet Take 45 mg by mouth at bedtime.   . simvastatin (ZOCOR) 10 MG tablet Take 10 mg by mouth daily.  . traMADol (ULTRAM) 50 MG tablet Take 50 mg by mouth 4 (four) times daily.  . [  DISCONTINUED] hydrochlorothiazide (HYDRODIURIL) 25 MG tablet Take 25 mg by mouth daily.  Marland Kitchen amLODipine (NORVASC) 5 MG tablet Take 1 tablet (5 mg total) by mouth daily.  Marland Kitchen PARoxetine (PAXIL) 10 MG tablet Take 1 tablet (10 mg total) by mouth daily.  . [DISCONTINUED] buPROPion (WELLBUTRIN) 100 MG tablet Take 200 mg by mouth daily.   . [DISCONTINUED] diazepam (VALIUM) 10 MG tablet Take 10 mg by mouth 3 (three) times daily as needed for anxiety.   . [DISCONTINUED] furosemide (LASIX) 20 MG tablet Take 20 mg by mouth.  . [DISCONTINUED] hydrochlorothiazide (HYDRODIURIL) 25 MG tablet Take 25 mg by mouth daily.  .  [DISCONTINUED] HYDROcodone-acetaminophen (NORCO) 10-325 MG tablet Take 1 tablet by mouth every 4 (four) hours as needed.  . [DISCONTINUED] ibuprofen (ADVIL,MOTRIN) 800 MG tablet Take 800 mg by mouth every 8 (eight) hours as needed.   . [DISCONTINUED] lisinopril (PRINIVIL,ZESTRIL) 10 MG tablet Take 10 mg by mouth daily.   . [DISCONTINUED] pregabalin (LYRICA) 50 MG capsule Take 50 mg by mouth 3 (three) times daily as needed.  . [DISCONTINUED] tapentadol (NUCYNTA) 50 MG TABS tablet Take 1 tablet by mouth every 8 (eight) hours as needed for moderate pain.    No facility-administered encounter medications on file as of 10/14/2016.     Past Medical History:  Diagnosis Date  . Allergy   . Anxiety   . Arthritis    degenerative disc disease neck and low back  . Chronic neck and back pain   . Chronic pain   . Depression   . GERD (gastroesophageal reflux disease)   . Hemorrhoids   . Hypercholesterolemia   . Hypertension   . Osteoporosis     Past Surgical History:  Procedure Laterality Date  . ABDOMINAL HYSTERECTOMY    . APPENDECTOMY    . CHOLECYSTECTOMY    . COLONOSCOPY  2009   Dr. Gala Romney: normal  . COLONOSCOPY WITH PROPOFOL N/A 12/18/2015   LAG:TXMI canal/internal hemorrhoids otherwise normal  . ESOPHAGOGASTRODUODENOSCOPY  2009   Dr. Gala Romney: normal, small hiatal hernia  . HERNIA REPAIR     umbilical  . NECK SURGERY    . SPINE SURGERY     neck    Social History   Social History  . Marital status: Married    Spouse name: Legrand Como  . Number of children: 3  . Years of education: 12   Occupational History  . disabled     nurse assistant   Social History Main Topics  . Smoking status: Never Smoker  . Smokeless tobacco: Never Used     Comment: Never smoked  . Alcohol use 0.0 oz/week     Comment: social alcohol   . Drug use: No  . Sexual activity: Yes    Birth control/ protection: Surgical   Other Topics Concern  . Not on file   Social History Narrative   Disabled    Lives with husband, husband has cancer - unknown type "he will not tell me"   Two daughters, one grand daughter    Family History  Problem Relation Age of Onset  . Parkinson's disease Father     Deceased  . Colon cancer Father     diagnosed in his 32s   . Brain cancer Mother     Deceased  . Cancer Mother 32    brain  . COPD Sister   . Cancer Brother     skin   . Arthritis Brother     back problems  . Seizures  Daughter   . Seizures Daughter     Review of Systems  Constitutional: Positive for malaise/fatigue. Negative for chills, fever and weight loss.  HENT: Negative for congestion and hearing loss.   Eyes: Negative for blurred vision and pain.  Respiratory: Negative for cough and shortness of breath.   Cardiovascular: Negative for chest pain and leg swelling.  Gastrointestinal: Negative for abdominal pain, constipation, diarrhea and heartburn.  Genitourinary: Negative for dysuria and frequency.  Musculoskeletal: Positive for myalgias and neck pain. Negative for falls and joint pain.  Neurological: Positive for weakness. Negative for dizziness, seizures and headaches.  Psychiatric/Behavioral: Positive for depression and memory loss. Negative for hallucinations, substance abuse and suicidal ideas. The patient is nervous/anxious and has insomnia.    BP 124/80   Pulse (!) 120   Resp 16   Ht '5\' 2"'  (1.575 m)   Wt 155 lb (70.3 kg)   SpO2 99%   BMI 28.35 kg/m   Physical Exam  Constitutional: She is oriented to person, place, and time. She appears well-developed and well-nourished. She appears distressed.  Emotional distress, lability, tearfulness  Eyes: Conjunctivae are normal. Pupils are equal, round, and reactive to light.  Neck:  Decreased range of motion  Cardiovascular: Normal rate, regular rhythm and normal heart sounds.   Pulmonary/Chest: Effort normal and breath sounds normal. No respiratory distress.  Lymphadenopathy:    She has no cervical adenopathy.    Neurological: She is alert and oriented to person, place, and time.  Psychiatric:  Some slurring of speech. Mild sedation. Emotional lability. Confusion regarding medications.   ASSESSMENT/PLAN:  1. Generalized anxiety disorder Stop Xanax and Valium. The Xanax is to be tapered over one month. Start paroxetine 10 mg a day.  2. Chronic pain syndrome Stop hydrocodone. May take tramadol 1-2 every 6 hours for pain. May use cyclobenzaprine as needed for muscle relaxer.  3. Degenerative disc disease, cervical Walking and daily stretching are recommended.  4. Hyperlipidemia, unspecified hyperlipidemia type - Lipid panel  5. Vitamin D deficiency  - VITAMIN D 25 Hydroxy (Vit-D Deficiency, Fractures)  6. Encounter for immunization  - Flu Vaccine QUAD 36+ mos IM  7. Encounter to establish care with new doctor Discussion Patient had impaired kidney function at her last lab draw in January. Results for NOU, CHARD (MRN [id number]) as of 10/14/2016 17:23  Ref. Range 12/13/2015 10:00  BUN Latest Ref Range: 6 - 20 mg/dL 22 (H)  Creatinine Latest Ref Range: 0.44 - 1.00 mg/dL 1.60 (H)  Calcium Latest Ref Range: 8.9 - 10.3 mg/dL 8.7 (L)  EGFR (Non-African Amer.) Latest Ref Range: >60 mL/min 34 (L)  EGFR (African American) Latest Ref Range: >60 mL/min 40 (L)  This is not been repeated. Because of this and stopping her diuretic hydrochlorothiazide and Lasix, and her lisinopril. I'm going to put her on amlodipine for her blood pressure. I'm going see her back in 2 weeks. Her blood work today.  I feel like she is clearly overmedicated with benzodiazepines. She has slurred speech and memory loss. She is told to immediately stop the Valium. She will reduce the alprazolam to 0.5 mg twice a day. In 2 weeks the goals try to get her down to once a day. I have started her Paxil to try and help with her anxiety symptoms.  I expended the patient that I will not give her chronic narcotic medication. Tramadol  is a strong dosing think she should need. She should probably be on an anti-inflammatory medication. She  probably needs physical therapy. She needs to see and neck specialist, especially for neck is causing her this much pain. She is agreeable to reduce her medication, stop controlled substances and see specialty evaluation.  Patient Instructions  1.  Need records Dr Gerarda Fraction 2. Need blood tests today 3. STOP lisinopril, hydrochlorothiazide, lasix and valium 4. Take tramadol primarily for pain.  May take 1 or 2 tabs every 6 hours up to 8 maximum a day 5. REDUCE the xanax to twice a day. (alprazolam) 6. Try NOT to take the narcotic hydrocodone unless pain Is severe 7. New medicines: amlodipine once day for blood pressure, paroxetine once a day for anxiety  See me in 2 weeks Call sooner for problems    60 minutes is spent with this patient. Much medicine discussion of her overmedication, and the changes that are necessary for her safety. Raylene Everts, MD

## 2016-10-21 DIAGNOSIS — G40019 Localization-related (focal) (partial) idiopathic epilepsy and epileptic syndromes with seizures of localized onset, intractable, without status epilepticus: Secondary | ICD-10-CM | POA: Diagnosis not present

## 2016-10-21 DIAGNOSIS — R296 Repeated falls: Secondary | ICD-10-CM | POA: Diagnosis not present

## 2016-10-21 DIAGNOSIS — Z9181 History of falling: Secondary | ICD-10-CM | POA: Diagnosis not present

## 2016-10-21 DIAGNOSIS — G3184 Mild cognitive impairment, so stated: Secondary | ICD-10-CM | POA: Diagnosis not present

## 2016-10-21 DIAGNOSIS — I951 Orthostatic hypotension: Secondary | ICD-10-CM | POA: Diagnosis not present

## 2016-10-21 DIAGNOSIS — R413 Other amnesia: Secondary | ICD-10-CM | POA: Diagnosis not present

## 2016-10-21 DIAGNOSIS — G43C1 Periodic headache syndromes in child or adult, intractable: Secondary | ICD-10-CM | POA: Diagnosis not present

## 2016-10-22 ENCOUNTER — Encounter: Payer: Self-pay | Admitting: Family Medicine

## 2016-10-22 DIAGNOSIS — N189 Chronic kidney disease, unspecified: Secondary | ICD-10-CM | POA: Insufficient documentation

## 2016-10-25 DIAGNOSIS — I951 Orthostatic hypotension: Secondary | ICD-10-CM | POA: Diagnosis not present

## 2016-10-25 DIAGNOSIS — G40019 Localization-related (focal) (partial) idiopathic epilepsy and epileptic syndromes with seizures of localized onset, intractable, without status epilepticus: Secondary | ICD-10-CM | POA: Diagnosis not present

## 2016-10-25 DIAGNOSIS — R296 Repeated falls: Secondary | ICD-10-CM | POA: Diagnosis not present

## 2016-10-25 DIAGNOSIS — G3184 Mild cognitive impairment, so stated: Secondary | ICD-10-CM | POA: Diagnosis not present

## 2016-10-25 DIAGNOSIS — R413 Other amnesia: Secondary | ICD-10-CM | POA: Diagnosis not present

## 2016-10-25 DIAGNOSIS — Z9181 History of falling: Secondary | ICD-10-CM | POA: Diagnosis not present

## 2016-10-28 ENCOUNTER — Ambulatory Visit: Payer: Self-pay | Admitting: Family Medicine

## 2016-10-29 ENCOUNTER — Ambulatory Visit (INDEPENDENT_AMBULATORY_CARE_PROVIDER_SITE_OTHER): Payer: BLUE CROSS/BLUE SHIELD | Admitting: Family Medicine

## 2016-10-29 ENCOUNTER — Encounter: Payer: Self-pay | Admitting: Family Medicine

## 2016-10-29 VITALS — BP 122/64 | HR 96 | Temp 98.0°F | Resp 16 | Ht 62.0 in | Wt 160.0 lb

## 2016-10-29 DIAGNOSIS — G894 Chronic pain syndrome: Secondary | ICD-10-CM | POA: Diagnosis not present

## 2016-10-29 DIAGNOSIS — M5030 Other cervical disc degeneration, unspecified cervical region: Secondary | ICD-10-CM | POA: Diagnosis not present

## 2016-10-29 DIAGNOSIS — I10 Essential (primary) hypertension: Secondary | ICD-10-CM

## 2016-10-29 DIAGNOSIS — F411 Generalized anxiety disorder: Secondary | ICD-10-CM

## 2016-10-29 DIAGNOSIS — L301 Dyshidrosis [pompholyx]: Secondary | ICD-10-CM

## 2016-10-29 DIAGNOSIS — E785 Hyperlipidemia, unspecified: Secondary | ICD-10-CM | POA: Diagnosis not present

## 2016-10-29 DIAGNOSIS — E559 Vitamin D deficiency, unspecified: Secondary | ICD-10-CM | POA: Diagnosis not present

## 2016-10-29 MED ORDER — BETAMETHASONE DIPROPIONATE 0.05 % EX CREA: TOPICAL_CREAM | Freq: Two times a day (BID) | CUTANEOUS | 0 refills | Status: DC

## 2016-10-29 NOTE — Progress Notes (Signed)
Chief Complaint  Patient presents with  . Follow-up    2 week   Here for follow up Is not able to name her medicines or what they are for She got her valium filled 2 d after our last visit, after I had emphasized to her that it needed to be stopped.  Seems unaware of this, and says she is not taking.  She is trying not to take her hydrocodone.  Pain has been manageable. Complains of rash on palm No new complaints Still did nt get the labs I recommended, but promises to do so today.  Patient Active Problem List   Diagnosis Date Noted  . 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 Chronic kidney disease 10/22/2016  . Generalized anxiety disorder 10/14/2016  . Chronic pain syndrome 10/14/2016  . Degenerative disc disease, cervical 10/14/2016  . Hemorrhoid   . Rectal bleeding 11/21/2015  . Constipation 09/20/2015  . Chest pain 09/19/2014  . HTN (hypertension) 09/19/2014  . Hyperlipidemia 09/19/2014  . Obesity 09/19/2014  . Radicular low back pain 03/21/2014  . Leg weakness, bilateral 03/21/2014    Outpatient Encounter Prescriptions as of 10/29/2016  Medication Sig  . ALPRAZolam (XANAX) 0.5 MG tablet Take 0.5 mg by mouth 2 (two) times daily as needed for anxiety (take as needed).   Marland Kitchen amLODipine (NORVASC) 5 MG tablet Take 1 tablet (5 mg total) by mouth daily.  . cyclobenzaprine (FLEXERIL) 10 MG tablet Take 1 tablet by mouth 2 (two) times daily.  Marland Kitchen dexlansoprazole (DEXILANT) 60 MG capsule Take 60 mg by mouth 2 (two) times daily.  Marland Kitchen docusate sodium (COLACE) 100 MG capsule Take 100 mg by mouth 2 (two) times daily.  . mirtazapine (REMERON) 15 MG tablet Take 45 mg by mouth at bedtime.   Marland Kitchen PARoxetine (PAXIL) 10 MG tablet Take 1 tablet (10 mg total) by mouth daily.  . simvastatin (ZOCOR) 10 MG tablet Take 10 mg by mouth daily.  . traMADol (ULTRAM) 50 MG tablet Take 50 mg by mouth 4 (four) times daily.  . betamethasone dipropionate (DIPROLENE) 0.05 % cream Apply topically 2  (two) times daily.   No facility-administered encounter medications on file as of 10/29/2016.     Allergies  Allergen Reactions  . Prednisone Anaphylaxis    Review of Systems  Constitutional: Negative for activity change and appetite change.  HENT: Negative for trouble swallowing and voice change.   Eyes: Negative for photophobia and visual disturbance.  Respiratory: Negative for cough, choking and shortness of breath.   Cardiovascular: Negative for chest pain, palpitations and leg swelling.  Gastrointestinal: Positive for constipation. Negative for nausea and vomiting.  Genitourinary: Negative for difficulty urinating.  Musculoskeletal: Positive for arthralgias, neck pain and neck stiffness.  Neurological: Positive for dizziness. Negative for seizures.       Vague history of seizures ?  Psychiatric/Behavioral: Positive for behavioral problems, confusion and dysphoric mood. The patient is nervous/anxious.     BP 122/64 (BP Location: Right Arm, Patient Position: Sitting, Cuff Size: Normal)   Pulse 96   Temp 98 F (36.7 C) (Oral)   Resp 16   Ht 5\' 2"  (1.575 m)   Wt 160 lb 0.6 oz (72.6 kg)   SpO2 98%   BMI 29.27 kg/m   Physical Exam  Constitutional: She is oriented to person, place, and time. She appears well-developed and well-nourished. No distress.  HENT:  Head: Normocephalic and atraumatic.  Mouth/Throat: Oropharynx is clear and moist.  Eyes: Pupils are equal,  round, and reactive to light.  Neck: Normal range of motion. Neck supple.  Normal motion  Cardiovascular: Normal rate, regular rhythm and normal heart sounds.   Pulmonary/Chest: Effort normal and breath sounds normal.  Neurological: She is alert and oriented to person, place, and time.  Psychiatric: She has a normal mood and affect. Her behavior is normal.  Poor understanding/comprehension    ASSESSMENT/PLAN: Essential hypertension  Vesicular palmoplantar eczema of hand  Degenerative disc disease,  cervical  Chronic pain syndrome  Generalized anxiety disorder   Patient Instructions  GO NEXT DOOR TODAY TO GET YOUR LAB TESTING  Use the hand cream twice a day for your rash  Continue on the new medicine regimen (list is given to you)  Continue to avoid the hydrocodone Continue to avoid the valium ( diazepam) and take xanax on a reduced schedule  Make sure that I get a copy of notes Dr Merlene Laughter  See me in  A month               Stephanie Everts, MD

## 2016-10-29 NOTE — Patient Instructions (Addendum)
GO NEXT DOOR TODAY TO GET YOUR LAB TESTING  Use the hand cream twice a day for your rash  Continue on the new medicine regimen (list is given to you)  Continue to avoid the hydrocodone Continue to avoid the valium ( diazepam) and take xanax on a reduced schedule  Make sure that I get a copy of notes Dr Merlene Laughter  See me in  A month

## 2016-10-30 ENCOUNTER — Encounter: Payer: Self-pay | Admitting: Family Medicine

## 2016-10-30 DIAGNOSIS — Z9181 History of falling: Secondary | ICD-10-CM | POA: Diagnosis not present

## 2016-10-30 DIAGNOSIS — R296 Repeated falls: Secondary | ICD-10-CM | POA: Diagnosis not present

## 2016-10-30 DIAGNOSIS — I951 Orthostatic hypotension: Secondary | ICD-10-CM | POA: Diagnosis not present

## 2016-10-30 DIAGNOSIS — R413 Other amnesia: Secondary | ICD-10-CM | POA: Diagnosis not present

## 2016-10-30 DIAGNOSIS — G3184 Mild cognitive impairment, so stated: Secondary | ICD-10-CM | POA: Diagnosis not present

## 2016-10-30 DIAGNOSIS — G40019 Localization-related (focal) (partial) idiopathic epilepsy and epileptic syndromes with seizures of localized onset, intractable, without status epilepticus: Secondary | ICD-10-CM | POA: Diagnosis not present

## 2016-10-30 LAB — COMPREHENSIVE METABOLIC PANEL
ALBUMIN: 3.9 g/dL (ref 3.6–5.1)
ALK PHOS: 66 U/L (ref 33–130)
ALT: 16 U/L (ref 6–29)
AST: 14 U/L (ref 10–35)
BUN: 15 mg/dL (ref 7–25)
CALCIUM: 9.1 mg/dL (ref 8.6–10.4)
CO2: 27 mmol/L (ref 20–31)
Chloride: 104 mmol/L (ref 98–110)
Creat: 1.4 mg/dL — ABNORMAL HIGH (ref 0.50–1.05)
Glucose, Bld: 78 mg/dL (ref 65–99)
POTASSIUM: 4.4 mmol/L (ref 3.5–5.3)
Sodium: 139 mmol/L (ref 135–146)
TOTAL PROTEIN: 6.6 g/dL (ref 6.1–8.1)
Total Bilirubin: 0.2 mg/dL (ref 0.2–1.2)

## 2016-10-30 LAB — URINALYSIS, ROUTINE W REFLEX MICROSCOPIC
Bilirubin Urine: NEGATIVE
Glucose, UA: NEGATIVE
Hgb urine dipstick: NEGATIVE
KETONES UR: NEGATIVE
Leukocytes, UA: NEGATIVE
NITRITE: NEGATIVE
PH: 7 (ref 5.0–8.0)
Protein, ur: NEGATIVE
SPECIFIC GRAVITY, URINE: 1.011 (ref 1.001–1.035)

## 2016-10-30 LAB — CBC
HEMATOCRIT: 32.3 % — AB (ref 35.0–45.0)
HEMOGLOBIN: 10.6 g/dL — AB (ref 11.7–15.5)
MCH: 29 pg (ref 27.0–33.0)
MCHC: 32.8 g/dL (ref 32.0–36.0)
MCV: 88.3 fL (ref 80.0–100.0)
MPV: 8.4 fL (ref 7.5–12.5)
Platelets: 313 10*3/uL (ref 140–400)
RBC: 3.66 MIL/uL — AB (ref 3.80–5.10)
RDW: 15.1 % — ABNORMAL HIGH (ref 11.0–15.0)
WBC: 7.1 10*3/uL (ref 3.8–10.8)

## 2016-10-30 LAB — LIPID PANEL
CHOL/HDL RATIO: 6.5 ratio — AB (ref <5.0)
CHOLESTEROL: 261 mg/dL — AB (ref <200)
HDL: 40 mg/dL — AB (ref >50)
TRIGLYCERIDES: 571 mg/dL — AB (ref <150)

## 2016-10-30 LAB — VITAMIN D 25 HYDROXY (VIT D DEFICIENCY, FRACTURES): Vit D, 25-Hydroxy: 30 ng/mL (ref 30–100)

## 2016-10-30 LAB — TSH: TSH: 1.62 m[IU]/L

## 2016-11-04 DIAGNOSIS — G43C1 Periodic headache syndromes in child or adult, intractable: Secondary | ICD-10-CM | POA: Diagnosis not present

## 2016-11-04 DIAGNOSIS — G3184 Mild cognitive impairment, so stated: Secondary | ICD-10-CM | POA: Diagnosis not present

## 2016-11-04 DIAGNOSIS — F5101 Primary insomnia: Secondary | ICD-10-CM | POA: Diagnosis not present

## 2016-11-04 DIAGNOSIS — F419 Anxiety disorder, unspecified: Secondary | ICD-10-CM | POA: Diagnosis not present

## 2016-11-04 DIAGNOSIS — R413 Other amnesia: Secondary | ICD-10-CM | POA: Diagnosis not present

## 2016-11-04 DIAGNOSIS — R296 Repeated falls: Secondary | ICD-10-CM | POA: Diagnosis not present

## 2016-11-04 DIAGNOSIS — R4182 Altered mental status, unspecified: Secondary | ICD-10-CM | POA: Diagnosis not present

## 2016-11-04 DIAGNOSIS — R269 Unspecified abnormalities of gait and mobility: Secondary | ICD-10-CM | POA: Diagnosis not present

## 2016-11-04 DIAGNOSIS — I951 Orthostatic hypotension: Secondary | ICD-10-CM | POA: Diagnosis not present

## 2016-11-04 DIAGNOSIS — G40019 Localization-related (focal) (partial) idiopathic epilepsy and epileptic syndromes with seizures of localized onset, intractable, without status epilepticus: Secondary | ICD-10-CM | POA: Diagnosis not present

## 2016-11-07 DIAGNOSIS — G3184 Mild cognitive impairment, so stated: Secondary | ICD-10-CM | POA: Diagnosis not present

## 2016-11-07 DIAGNOSIS — G40019 Localization-related (focal) (partial) idiopathic epilepsy and epileptic syndromes with seizures of localized onset, intractable, without status epilepticus: Secondary | ICD-10-CM | POA: Diagnosis not present

## 2016-11-07 DIAGNOSIS — Z9181 History of falling: Secondary | ICD-10-CM | POA: Diagnosis not present

## 2016-11-07 DIAGNOSIS — I951 Orthostatic hypotension: Secondary | ICD-10-CM | POA: Diagnosis not present

## 2016-11-07 DIAGNOSIS — R413 Other amnesia: Secondary | ICD-10-CM | POA: Diagnosis not present

## 2016-11-07 DIAGNOSIS — R296 Repeated falls: Secondary | ICD-10-CM | POA: Diagnosis not present

## 2016-11-08 DIAGNOSIS — G3184 Mild cognitive impairment, so stated: Secondary | ICD-10-CM | POA: Diagnosis not present

## 2016-11-08 DIAGNOSIS — I951 Orthostatic hypotension: Secondary | ICD-10-CM | POA: Diagnosis not present

## 2016-11-08 DIAGNOSIS — R296 Repeated falls: Secondary | ICD-10-CM | POA: Diagnosis not present

## 2016-11-08 DIAGNOSIS — G40019 Localization-related (focal) (partial) idiopathic epilepsy and epileptic syndromes with seizures of localized onset, intractable, without status epilepticus: Secondary | ICD-10-CM | POA: Diagnosis not present

## 2016-11-08 DIAGNOSIS — R413 Other amnesia: Secondary | ICD-10-CM | POA: Diagnosis not present

## 2016-11-08 DIAGNOSIS — Z9181 History of falling: Secondary | ICD-10-CM | POA: Diagnosis not present

## 2016-11-11 DIAGNOSIS — G3184 Mild cognitive impairment, so stated: Secondary | ICD-10-CM | POA: Diagnosis not present

## 2016-11-11 DIAGNOSIS — R413 Other amnesia: Secondary | ICD-10-CM | POA: Diagnosis not present

## 2016-11-11 DIAGNOSIS — G40019 Localization-related (focal) (partial) idiopathic epilepsy and epileptic syndromes with seizures of localized onset, intractable, without status epilepticus: Secondary | ICD-10-CM | POA: Diagnosis not present

## 2016-11-11 DIAGNOSIS — Z9181 History of falling: Secondary | ICD-10-CM | POA: Diagnosis not present

## 2016-11-11 DIAGNOSIS — R296 Repeated falls: Secondary | ICD-10-CM | POA: Diagnosis not present

## 2016-11-11 DIAGNOSIS — I951 Orthostatic hypotension: Secondary | ICD-10-CM | POA: Diagnosis not present

## 2016-11-12 DIAGNOSIS — G40019 Localization-related (focal) (partial) idiopathic epilepsy and epileptic syndromes with seizures of localized onset, intractable, without status epilepticus: Secondary | ICD-10-CM | POA: Diagnosis not present

## 2016-11-12 DIAGNOSIS — Z9181 History of falling: Secondary | ICD-10-CM | POA: Diagnosis not present

## 2016-11-12 DIAGNOSIS — I951 Orthostatic hypotension: Secondary | ICD-10-CM | POA: Diagnosis not present

## 2016-11-12 DIAGNOSIS — R296 Repeated falls: Secondary | ICD-10-CM | POA: Diagnosis not present

## 2016-11-12 DIAGNOSIS — R413 Other amnesia: Secondary | ICD-10-CM | POA: Diagnosis not present

## 2016-11-12 DIAGNOSIS — G3184 Mild cognitive impairment, so stated: Secondary | ICD-10-CM | POA: Diagnosis not present

## 2016-11-20 DIAGNOSIS — R296 Repeated falls: Secondary | ICD-10-CM | POA: Diagnosis not present

## 2016-11-20 DIAGNOSIS — I951 Orthostatic hypotension: Secondary | ICD-10-CM | POA: Diagnosis not present

## 2016-11-20 DIAGNOSIS — G40019 Localization-related (focal) (partial) idiopathic epilepsy and epileptic syndromes with seizures of localized onset, intractable, without status epilepticus: Secondary | ICD-10-CM | POA: Diagnosis not present

## 2016-11-20 DIAGNOSIS — Z9181 History of falling: Secondary | ICD-10-CM | POA: Diagnosis not present

## 2016-11-20 DIAGNOSIS — R413 Other amnesia: Secondary | ICD-10-CM | POA: Diagnosis not present

## 2016-11-20 DIAGNOSIS — G3184 Mild cognitive impairment, so stated: Secondary | ICD-10-CM | POA: Diagnosis not present

## 2016-11-22 DIAGNOSIS — R296 Repeated falls: Secondary | ICD-10-CM | POA: Diagnosis not present

## 2016-11-22 DIAGNOSIS — I951 Orthostatic hypotension: Secondary | ICD-10-CM | POA: Diagnosis not present

## 2016-11-22 DIAGNOSIS — G3184 Mild cognitive impairment, so stated: Secondary | ICD-10-CM | POA: Diagnosis not present

## 2016-11-22 DIAGNOSIS — G40019 Localization-related (focal) (partial) idiopathic epilepsy and epileptic syndromes with seizures of localized onset, intractable, without status epilepticus: Secondary | ICD-10-CM | POA: Diagnosis not present

## 2016-11-22 DIAGNOSIS — Z9181 History of falling: Secondary | ICD-10-CM | POA: Diagnosis not present

## 2016-11-22 DIAGNOSIS — R413 Other amnesia: Secondary | ICD-10-CM | POA: Diagnosis not present

## 2016-11-28 ENCOUNTER — Ambulatory Visit: Payer: Self-pay | Admitting: Family Medicine

## 2016-12-04 ENCOUNTER — Encounter: Payer: Self-pay | Admitting: Family Medicine

## 2016-12-04 ENCOUNTER — Ambulatory Visit (INDEPENDENT_AMBULATORY_CARE_PROVIDER_SITE_OTHER): Payer: Medicare Other | Admitting: Family Medicine

## 2016-12-04 VITALS — BP 128/74 | HR 112 | Temp 98.7°F | Resp 18 | Ht 62.0 in | Wt 152.0 lb

## 2016-12-04 DIAGNOSIS — I10 Essential (primary) hypertension: Secondary | ICD-10-CM | POA: Diagnosis not present

## 2016-12-04 DIAGNOSIS — F411 Generalized anxiety disorder: Secondary | ICD-10-CM

## 2016-12-04 DIAGNOSIS — G894 Chronic pain syndrome: Secondary | ICD-10-CM | POA: Diagnosis not present

## 2016-12-04 MED ORDER — SIMVASTATIN 20 MG PO TABS: 20.0000 mg | ORAL_TABLET | Freq: Every day | ORAL | 3 refills | Status: DC

## 2016-12-04 NOTE — Progress Notes (Signed)
Chief Complaint  Patient presents with  . Follow-up    1 month   Here for follow up.  She is trying not to take her hydrocodone.  Pain has been manageable. States she has not been taking every day. She continues to take her present laboratory 3 times a day. She states she has stopped her Valium. She feels like her anxiety is well controlled. Complains of rash on palm. Unchanged with prescription of cortisone. No new complaints Blood pressure is well controlled today. Her labs are reviewed from last visit. She has hyperlipidemia. She states she is out of her simvastatin. That is refilled today. The importance of staying on simvastatin is reviewed with her. She also has a mild anemia. She states is chronic. She is advised to take a women's vitamin with iron.  Patient Active Problem List   Diagnosis Date Noted  . 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 Chronic kidney disease 10/22/2016  . Generalized anxiety disorder 10/14/2016  . Chronic pain syndrome 10/14/2016  . Degenerative disc disease, cervical 10/14/2016  . Hemorrhoid   . Rectal bleeding 11/21/2015  . Constipation 09/20/2015  . Chest pain 09/19/2014  . HTN (hypertension) 09/19/2014  . Hyperlipidemia 09/19/2014  . Obesity 09/19/2014  . Radicular low back pain 03/21/2014  . Leg weakness, bilateral 03/21/2014    Outpatient Encounter Prescriptions as of 12/04/2016  Medication Sig  . ALPRAZolam (XANAX) 0.5 MG tablet Take 0.5 mg by mouth 2 (two) times daily as needed for anxiety (take as needed).   Marland Kitchen amLODipine (NORVASC) 5 MG tablet Take 1 tablet (5 mg total) by mouth daily.  . betamethasone dipropionate (DIPROLENE) 0.05 % cream Apply topically 2 (two) times daily.  . cyclobenzaprine (FLEXERIL) 10 MG tablet Take 1 tablet by mouth 2 (two) times daily.  Marland Kitchen dexlansoprazole (DEXILANT) 60 MG capsule Take 60 mg by mouth 2 (two) times daily.  Marland Kitchen docusate sodium (COLACE) 100 MG capsule Take 100 mg by mouth 2 (two) times  daily.  . mirtazapine (REMERON) 15 MG tablet Take 45 mg by mouth at bedtime.   Marland Kitchen PARoxetine (PAXIL) 10 MG tablet Take 1 tablet (10 mg total) by mouth daily.  . simvastatin (ZOCOR) 20 MG tablet Take 1 tablet (20 mg total) by mouth daily.  . traMADol (ULTRAM) 50 MG tablet Take 50 mg by mouth 4 (four) times daily.   No facility-administered encounter medications on file as of 12/04/2016.     Allergies  Allergen Reactions  . Prednisone Anaphylaxis    "thinks" it was prednisone, says it was a shot    Review of Systems  Constitutional: Negative for activity change and appetite change.  HENT: Negative for trouble swallowing and voice change.   Eyes: Negative for photophobia and visual disturbance.  Respiratory: Negative for cough, choking and shortness of breath.   Cardiovascular: Negative for chest pain, palpitations and leg swelling.  Gastrointestinal: Positive for constipation. Negative for nausea and vomiting.  Genitourinary: Negative for difficulty urinating.  Musculoskeletal: Positive for arthralgias, neck pain and neck stiffness.       Improved  Neurological: Positive for dizziness. Negative for seizures.  Psychiatric/Behavioral: Positive for dysphoric mood. The patient is nervous/anxious.        Stable    BP 128/74   Pulse (!) 112   Temp 98.7 F (37.1 C) (Oral)   Resp 18   Ht 5\' 2"  (1.575 m)   Wt 152 lb (68.9 kg)   SpO2 98%   BMI 27.80 kg/m  Physical Exam  Constitutional: She is oriented to person, place, and time. She appears well-developed and well-nourished. No distress.  HENT:  Head: Normocephalic and atraumatic.  Mouth/Throat: Oropharynx is clear and moist.  Eyes: Pupils are equal, round, and reactive to light.  Neck: Normal range of motion. Neck supple.  Normal motion  Cardiovascular: Normal rate, regular rhythm and normal heart sounds.   Pulmonary/Chest: Effort normal and breath sounds normal.  Neurological: She is alert and oriented to person, place, and  time.  Psychiatric: She has a normal mood and affect. Her behavior is normal.  Poor understanding/comprehension    ASSESSMENT/PLAN: Essential hypertension  Chronic pain syndrome  Generalized anxiety disorder  Hyperlipidemia  Patient Instructions   Drink more water Try to walk everyday that you are able Take the cholesterol pill every day Take a multiple vitamin for women  Congratulations on cutting back on the pain medicine  See me in 3 months  Call sooner for problems   Raylene Everts, MD

## 2016-12-04 NOTE — Patient Instructions (Signed)
  Drink more water Try to walk everyday that you are able Take the cholesterol pill every day Take a multiple vitamin for women  Congratulations on cutting back on the pain medicine  See me in 3 months  Call sooner for problems

## 2017-01-27 ENCOUNTER — Emergency Department (HOSPITAL_COMMUNITY)
Admission: EM | Admit: 2017-01-27 | Discharge: 2017-01-27 | Disposition: A | Discharge status: Home / Self Care | Payer: Medicare Other | Attending: Emergency Medicine | Admitting: Emergency Medicine

## 2017-01-27 ENCOUNTER — Encounter (HOSPITAL_COMMUNITY): Payer: Self-pay | Admitting: Emergency Medicine

## 2017-01-27 DIAGNOSIS — I10 Essential (primary) hypertension: Secondary | ICD-10-CM | POA: Insufficient documentation

## 2017-01-27 DIAGNOSIS — Z79899 Other long term (current) drug therapy: Secondary | ICD-10-CM | POA: Insufficient documentation

## 2017-01-27 DIAGNOSIS — J020 Streptococcal pharyngitis: Secondary | ICD-10-CM

## 2017-01-27 DIAGNOSIS — R0981 Nasal congestion: Secondary | ICD-10-CM | POA: Diagnosis present

## 2017-01-27 LAB — RAPID STREP SCREEN (MED CTR MEBANE ONLY): STREPTOCOCCUS, GROUP A SCREEN (DIRECT): POSITIVE — AB

## 2017-01-27 MED ORDER — AMOXICILLIN 500 MG PO CAPS: 500.0000 mg | ORAL_CAPSULE | Freq: Two times a day (BID) | ORAL | 0 refills | Status: DC

## 2017-01-27 MED ORDER — AMOXICILLIN 250 MG PO CAPS
500.0000 mg | ORAL_CAPSULE | Freq: Once | ORAL | Status: AC
  Administered 2017-01-27: 500 mg via ORAL
  Filled 2017-01-27: qty 2

## 2017-01-27 NOTE — Discharge Instructions (Signed)
Drink plenty of water.  Take the antibiotic as directed until its finished.  Follow-up with your doctor for recheck.  Tylenol every 4 hrs if needed for fever or pain

## 2017-01-27 NOTE — ED Notes (Signed)
Patient given discharge instruction, verbalized understand. Patient ambulatory out of the department.  

## 2017-01-27 NOTE — ED Notes (Signed)
PA and student at the bedside.

## 2017-01-27 NOTE — ED Triage Notes (Signed)
Pt reports choking sensation at night when trying to sleep as well as nasal congestion.  No meds taken.

## 2017-01-27 NOTE — ED Provider Notes (Signed)
[facility] DEPT Provider Note   CSN: [id number] Arrival date & time: 01/27/17  1108  By signing my name below, I, Stephanie [name], attest that this documentation has been prepared under the direction and in the presence of [name] PA-C.  Electronically Signed: Collene [name], Scribe. 01/27/17. 1:05 PM.  History   Chief Complaint Chief Complaint  Patient presents with  . Nasal Congestion    HPI Comments: Stephanie [name] is a 61 y.o. female with a hx of HTN,  Anxiety, and GERD, who presents to the Emergency Department complaining of a gradual-onset, intermittent nasal congestion that began a few days ago. Patient states her granddaughter was recently diagnosed with strep throat. Patient reports associated cough with clear sputum, generalized body aches, sinus pressure, and trouble swallowing "choking sensation". No modifying factors indicated. Patient reports going to see Dr. Meda Coffee, in which she was told to come here, because she is not in the office today. Patient reports having an endoscopy in the past, in which she was diagnosed with acid reflux, but current symptoms feel different. Patient takes Nexium twice a day for this problem. Patient denies any new medications, sore throat, fever, chills, nausea, chest pain, shortness of breath or vomiting.    The history is provided by the patient. No language interpreter was used.    Past Medical History:  Diagnosis Date  . Allergy   . Anxiety   . Arthritis    degenerative disc disease neck and low back  . Chronic neck and back pain   . Chronic pain   . Depression   . GERD (gastroesophageal reflux disease)   . Hemorrhoids   . Hypercholesterolemia   . Hypertension   . Osteoporosis     Patient Active Problem List   Diagnosis Date Noted  . 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 Chronic kidney disease 10/22/2016  . Generalized anxiety disorder 10/14/2016  . Chronic pain syndrome 10/14/2016  . Degenerative disc  disease, cervical 10/14/2016  . Hemorrhoid   . Rectal bleeding 11/21/2015  . Constipation 09/20/2015  . Chest pain 09/19/2014  . HTN (hypertension) 09/19/2014  . Hyperlipidemia 09/19/2014  . Obesity 09/19/2014  . Radicular low back pain 03/21/2014  . Leg weakness, bilateral 03/21/2014    Past Surgical History:  Procedure Laterality Date  . ABDOMINAL HYSTERECTOMY    . APPENDECTOMY    . CHOLECYSTECTOMY    . COLONOSCOPY  2009   Dr. Gala Romney: normal  . COLONOSCOPY WITH PROPOFOL N/A 12/18/2015   [id number] canal/internal hemorrhoids otherwise normal  . ESOPHAGOGASTRODUODENOSCOPY  2009   Dr. Gala Romney: normal, small hiatal hernia  . HERNIA REPAIR     umbilical  . NECK SURGERY    . SPINE SURGERY     neck    OB History    No data available       Home Medications    Prior to Admission medications   Medication Sig Start Date End Date Taking? Authorizing Provider  ALPRAZolam Duanne Moron) 0.5 MG tablet Take 0.5 mg by mouth 2 (two) times daily as needed for anxiety (take as needed).     Historical Provider, MD  amLODipine (NORVASC) 5 MG tablet Take 1 tablet (5 mg total) by mouth daily. 10/14/16   Raylene Everts, MD  betamethasone dipropionate (DIPROLENE) 0.05 % cream Apply topically 2 (two) times daily. 10/29/16   Raylene Everts, MD  cyclobenzaprine (FLEXERIL) 10 MG tablet Take 1 tablet by mouth 2 (two) times daily. 09/14/14   Historical Provider, MD  dexlansoprazole (DEXILANT) 60 MG capsule Take 60 mg by mouth 2 (two) times daily.    Historical Provider, MD  docusate sodium (COLACE) 100 MG capsule Take 100 mg by mouth 2 (two) times daily.    Historical Provider, MD  mirtazapine (REMERON) 15 MG tablet Take 45 mg by mouth at bedtime.     Historical Provider, MD  PARoxetine (PAXIL) 10 MG tablet Take 1 tablet (10 mg total) by mouth daily. 10/14/16   Raylene Everts, MD  simvastatin (ZOCOR) 20 MG tablet Take 1 tablet (20 mg total) by mouth daily. 12/04/16   Raylene Everts, MD  traMADol  (ULTRAM) 50 MG tablet Take 50 mg by mouth 4 (four) times daily.    Historical Provider, MD    Family History Family History  Problem Relation Age of Onset  . Parkinson's disease Father     Deceased  . Colon cancer Father     diagnosed in his 46s   . Brain cancer Mother     Deceased  . Cancer Mother 34    brain  . COPD Sister   . Cancer Brother     skin   . Arthritis Brother     back problems  . Seizures Daughter   . Seizures Daughter     Social History Social History  Substance Use Topics  . Smoking status: Never Smoker  . Smokeless tobacco: Never Used     Comment: Never smoked  . Alcohol use 0.0 oz/week     Comment: social alcohol      Allergies   Prednisone   Review of Systems Review of Systems  Constitutional: Negative for chills and fever.  HENT: Positive for congestion, sinus pressure and trouble swallowing. Negative for facial swelling, sore throat and voice change.   Respiratory: Positive for cough. Negative for chest tightness and shortness of breath.   Cardiovascular: Negative for chest pain.  Gastrointestinal: Negative for abdominal pain, nausea and vomiting.  Musculoskeletal: Positive for myalgias.  Skin: Negative for rash.  Neurological: Negative for syncope, weakness, numbness and headaches.  Hematological: Negative for adenopathy.     Physical Exam Updated Vital Signs BP 154/90 (BP Location: Left Arm)   Pulse 87   Temp 98.8 F (37.1 C) (Oral)   Resp 16   Ht 5\' 1"  (1.549 m)   Wt 150 lb (68 kg)   SpO2 99%   BMI 28.34 kg/m   Physical Exam  Constitutional: She is oriented to person, place, and time. She appears well-developed and well-nourished. No distress.  HENT:  Head: Normocephalic and atraumatic.  Right Ear: Tympanic membrane and ear canal normal.  Left Ear: Tympanic membrane and ear canal normal.  Mouth/Throat: Uvula is midline and mucous membranes are normal. No uvula swelling. Posterior oropharyngeal erythema present. No  oropharyngeal exudate or posterior oropharyngeal edema.  Erythema of the oropharynx. No edema or tonsillar exudate. Uvula midline and non-edematous.  No PTA  Eyes: Conjunctivae and EOM are normal. Pupils are equal, round, and reactive to light.  Neck: Normal range of motion. Neck supple. No JVD present. No tracheal deviation present.  Cardiovascular: Normal rate, regular rhythm and normal heart sounds.   Pulmonary/Chest: Effort normal and breath sounds normal. No stridor.  Abdominal: Soft. She exhibits no distension. There is no tenderness.  Musculoskeletal: Normal range of motion.  Lymphadenopathy:    She has no cervical adenopathy.  Neurological: She is alert and oriented to person, place, and time.  Skin: Skin is warm and dry. No rash  noted.  Psychiatric: She has a normal mood and affect.  Nursing note and vitals reviewed.    ED Treatments / Results  DIAGNOSTIC STUDIES: Oxygen Saturation is 99% on RA, normal by my interpretation.    COORDINATION OF CARE: 1:04 PM Discussed treatment plan with pt at bedside and pt agreed to plan, which includes antibiotics.   Labs (all labs ordered are listed, but only abnormal results are displayed) Labs Reviewed  RAPID STREP SCREEN (NOT AT [facility]) - Abnormal; Notable for the following:       Result Value   Streptococcus, Group A Screen (Direct) POSITIVE (*)    All other components within normal limits    EKG  EKG Interpretation None       Radiology No results found.  Procedures Procedures (including critical care time)  Medications Ordered in ED Medications - No data to display   Initial Impression / Assessment and Plan / ED Course  I have reviewed the triage vital signs and the nursing notes.  Pertinent labs & imaging results that were available during my care of the patient were reviewed by me and considered in my medical decision making (see chart for details).     Pt well appearing, vitals stable.  Airway patent.  No PTA.   Strep screen positive.  Appears stable for d/c, Rx for amoxil.  Agrees to tylenol or ibuprofen if needed for pain or fever.  Encouraged fluids.  Return precautions discussed  Final Clinical Impressions(s) / ED Diagnoses   Final diagnoses:  Streptococcal pharyngitis    New Prescriptions New Prescriptions   No medications on file   I personally performed the services described in this documentation, which was scribed in my presence. The recorded information has been reviewed and is accurate.     Kem Parkinson, PA-C 01/29/17 [place], MD 01/30/17 [phone]

## 2017-02-01 ENCOUNTER — Other Ambulatory Visit: Payer: Self-pay | Admitting: Family Medicine

## 2017-02-03 NOTE — Telephone Encounter (Signed)
Seen 12/04/16

## 2017-02-17 ENCOUNTER — Other Ambulatory Visit: Payer: Self-pay | Admitting: Family Medicine

## 2017-02-17 ENCOUNTER — Ambulatory Visit (INDEPENDENT_AMBULATORY_CARE_PROVIDER_SITE_OTHER): Payer: Medicare Other | Admitting: Otolaryngology

## 2017-02-17 DIAGNOSIS — H903 Sensorineural hearing loss, bilateral: Secondary | ICD-10-CM | POA: Diagnosis not present

## 2017-02-17 DIAGNOSIS — H9313 Tinnitus, bilateral: Secondary | ICD-10-CM

## 2017-02-18 ENCOUNTER — Ambulatory Visit: Payer: BLUE CROSS/BLUE SHIELD | Admitting: Family Medicine

## 2017-02-18 ENCOUNTER — Other Ambulatory Visit: Payer: Self-pay

## 2017-02-18 DIAGNOSIS — L403 Pustulosis palmaris et plantaris: Secondary | ICD-10-CM | POA: Diagnosis not present

## 2017-02-18 NOTE — Telephone Encounter (Signed)
Seen 12/04/16

## 2017-02-20 DIAGNOSIS — L403 Pustulosis palmaris et plantaris: Secondary | ICD-10-CM | POA: Diagnosis not present

## 2017-03-17 ENCOUNTER — Other Ambulatory Visit: Payer: Self-pay | Admitting: Family Medicine

## 2017-03-17 NOTE — Telephone Encounter (Signed)
Seen 12/04/16

## 2017-04-03 DIAGNOSIS — H5203 Hypermetropia, bilateral: Secondary | ICD-10-CM | POA: Diagnosis not present

## 2017-04-03 DIAGNOSIS — H52221 Regular astigmatism, right eye: Secondary | ICD-10-CM | POA: Diagnosis not present

## 2017-04-03 DIAGNOSIS — D3131 Benign neoplasm of right choroid: Secondary | ICD-10-CM | POA: Diagnosis not present

## 2017-04-03 DIAGNOSIS — H25813 Combined forms of age-related cataract, bilateral: Secondary | ICD-10-CM | POA: Diagnosis not present

## 2017-04-04 ENCOUNTER — Telehealth: Payer: Self-pay | Admitting: Family Medicine

## 2017-04-04 ENCOUNTER — Encounter: Payer: Self-pay | Admitting: Family Medicine

## 2017-04-04 ENCOUNTER — Ambulatory Visit (INDEPENDENT_AMBULATORY_CARE_PROVIDER_SITE_OTHER): Payer: Medicare Other | Admitting: Family Medicine

## 2017-04-04 VITALS — BP 136/70 | HR 92 | Temp 97.8°F | Resp 18 | Ht 62.0 in | Wt 151.1 lb

## 2017-04-04 DIAGNOSIS — F411 Generalized anxiety disorder: Secondary | ICD-10-CM | POA: Diagnosis not present

## 2017-04-04 DIAGNOSIS — I10 Essential (primary) hypertension: Secondary | ICD-10-CM | POA: Diagnosis not present

## 2017-04-04 DIAGNOSIS — E785 Hyperlipidemia, unspecified: Secondary | ICD-10-CM

## 2017-04-04 DIAGNOSIS — R05 Cough: Secondary | ICD-10-CM

## 2017-04-04 DIAGNOSIS — N183 Chronic kidney disease, stage 3 unspecified: Secondary | ICD-10-CM

## 2017-04-04 DIAGNOSIS — R058 Other specified cough: Secondary | ICD-10-CM

## 2017-04-04 LAB — BASIC METABOLIC PANEL WITH GFR
BUN: 13 mg/dL (ref 7–25)
CHLORIDE: 107 mmol/L (ref 98–110)
CO2: 25 mmol/L (ref 20–31)
Calcium: 9 mg/dL (ref 8.6–10.4)
Creat: 1.29 mg/dL — ABNORMAL HIGH (ref 0.50–1.05)
GFR, EST AFRICAN AMERICAN: 52 mL/min — AB (ref >=60)
GFR, EST NON AFRICAN AMERICAN: 45 mL/min — AB (ref >=60)
GLUCOSE: 96 mg/dL (ref 65–99)
POTASSIUM: 4.1 mmol/L (ref 3.5–5.3)
Sodium: 143 mmol/L (ref 135–146)

## 2017-04-04 LAB — LIPID PANEL
CHOL/HDL RATIO: 6 ratio — AB (ref <5.0)
Cholesterol: 269 mg/dL — ABNORMAL HIGH (ref <200)
HDL: 45 mg/dL — AB (ref >50)
LDL Cholesterol: 162 mg/dL — ABNORMAL HIGH (ref <100)
Triglycerides: 311 mg/dL — ABNORMAL HIGH (ref <150)
VLDL: 62 mg/dL — AB (ref <30)

## 2017-04-04 MED ORDER — CETIRIZINE HCL 10 MG PO TABS: 10.0000 mg | ORAL_TABLET | Freq: Every day | ORAL | 11 refills | Status: DC

## 2017-04-04 MED ORDER — PAROXETINE HCL 20 MG PO TABS: 20.0000 mg | ORAL_TABLET | Freq: Every day | ORAL | 3 refills | Status: DC

## 2017-04-04 MED ORDER — BENZONATATE 100 MG PO CAPS: 100.0000 mg | ORAL_CAPSULE | Freq: Two times a day (BID) | ORAL | 0 refills | Status: DC | PRN

## 2017-04-04 NOTE — Telephone Encounter (Signed)
Patient states she forgot to tell Dr.Nelson about about a knot the size of her hand under her arm, says it is painful.   cb#: [phone]

## 2017-04-04 NOTE — Patient Instructions (Addendum)
Take the zyrtec once a day Take the tessalon for cough Drink plenty of water If the post nasal drip does not decrease add flonase  Take the paxil 20 mg once a day Refill the other medicines  NEED BLOOD WORK TODAY  See me in 3 months Call sooner fo rproblems

## 2017-04-04 NOTE — Progress Notes (Signed)
Chief Complaint  Patient presents with  . Cough    x 1 month  needs refill paxil.  Still with anxiety.  Will adjust dose to 20 mg a day Never got the requested labs to check her kidney function.  Will re order Has a cough for 4 weeks.  PND.  Stuffy/runny nose.  No fever or recent illness.  Voice is hoarse.  Discussed likely allergic. Otherwise in generally anxious - but busy with family and functioning. No pain complaint  Patient Active Problem List   Diagnosis Date Noted  . 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 Chronic kidney disease 10/22/2016  . Generalized anxiety disorder 10/14/2016  . Chronic pain syndrome 10/14/2016  . Degenerative disc disease, cervical 10/14/2016  . Hemorrhoid   . Rectal bleeding 11/21/2015  . Constipation 09/20/2015  . Chest pain 09/19/2014  . HTN (hypertension) 09/19/2014  . Hyperlipidemia 09/19/2014  . Obesity 09/19/2014  . Radicular low back pain 03/21/2014  . Leg weakness, bilateral 03/21/2014    Outpatient Encounter Prescriptions as of 04/04/2017  Medication Sig  . ALPRAZolam (XANAX) 0.5 MG tablet Take 0.5 mg by mouth 2 (two) times daily as needed for anxiety (take as needed).   Marland Kitchen amLODipine (NORVASC) 5 MG tablet Take 1 tablet (5 mg total) by mouth daily. (Patient not taking: Reported on 04/04/2017)  . benzonatate (TESSALON) 100 MG capsule Take 1 capsule (100 mg total) by mouth 2 (two) times daily as needed for cough.  . betamethasone dipropionate (DIPROLENE) 0.05 % cream Apply topically 2 (two) times daily. (Patient not taking: Reported on 04/04/2017)  . buPROPion (WELLBUTRIN) 100 MG tablet Take 1 tablet by mouth 3 (three) times daily.  . cetirizine (ZYRTEC) 10 MG tablet Take 1 tablet (10 mg total) by mouth daily. For allergies  . dexlansoprazole (DEXILANT) 60 MG capsule Take 60 mg by mouth 2 (two) times daily.  Marland Kitchen docusate sodium (COLACE) 100 MG capsule Take 100 mg by mouth 2 (two) times daily.  Marland Kitchen lisinopril (PRINIVIL,ZESTRIL) 10  MG tablet TAKE 1 TABLET BY MOUTH ONCE DAILY FOR BLOOD PRESSURE. (Patient not taking: Reported on 04/04/2017)  . mirtazapine (REMERON) 15 MG tablet Take 45 mg by mouth at bedtime.   . mirtazapine (REMERON) 45 MG tablet TAKE 1 TABLET BY MOUTH AT BEDTIME FOR SLEEP. (Patient not taking: Reported on 04/04/2017)  . PARoxetine (PAXIL) 20 MG tablet Take 1 tablet (20 mg total) by mouth daily.  . simvastatin (ZOCOR) 20 MG tablet Take 1 tablet (20 mg total) by mouth daily. (Patient not taking: Reported on 04/04/2017)  . traMADol (ULTRAM) 50 MG tablet Take 50 mg by mouth 4 (four) times daily.   No facility-administered encounter medications on file as of 04/04/2017.     Allergies  Allergen Reactions  . Prednisone Anaphylaxis    "thinks" it was prednisone, says it was a shot    Review of Systems  Constitutional: Negative for chills, fever and unexpected weight change.  HENT: Positive for postnasal drip, rhinorrhea and voice change. Negative for congestion, dental problem, sinus pain, sinus pressure, sore throat and trouble swallowing.   Eyes: Negative for redness and visual disturbance.  Respiratory: Positive for cough. Negative for shortness of breath and wheezing.        Clear-white sputum  Cardiovascular: Negative for chest pain, palpitations and leg swelling.  Gastrointestinal: Negative for abdominal pain, constipation and diarrhea.  Genitourinary: Negative for difficulty urinating and frequency.  Musculoskeletal: Negative for arthralgias and back pain.       "  usual aches"  Neurological: Positive for headaches. Negative for dizziness.       Cough makes head hurt  Psychiatric/Behavioral: Negative for dysphoric mood and sleep disturbance. The patient is nervous/anxious and is hyperactive.     BP 136/70 (BP Location: Right Arm, Patient Position: Sitting, Cuff Size: Normal)   Pulse 92   Temp 97.8 F (36.6 C) (Temporal)   Resp 18   Ht 5\' 2"  (1.575 m)   Wt 151 lb 1.9 oz (68.5 kg)   SpO2 97%    BMI 27.64 kg/m   Physical Exam  Constitutional: She appears well-developed and well-nourished. No distress.  Mild hyperkinetic  HENT:  Head: Normocephalic and atraumatic.  Right Ear: External ear normal.  Left Ear: External ear normal.  Nose: Nose normal.  Mouth/Throat: Oropharynx is clear and moist.  Eyes: Conjunctivae are normal. Pupils are equal, round, and reactive to light.  Neck: Normal range of motion.  Cardiovascular: Normal rate and regular rhythm.   Pulmonary/Chest: Effort normal. She has no wheezes.  Lungs clear  Musculoskeletal: Normal range of motion. She exhibits no edema.  Lymphadenopathy:    She has no cervical adenopathy.  Neurological: She is alert.  Skin: Skin is warm and dry.  Psychiatric: She has a normal mood and affect. Her behavior is normal.    ASSESSMENT/PLAN:  1. Allergic cough Discussed OTC antihistamines and nasal steroids  2. Stage 3 chronic kidney disease - BASIC METABOLIC PANEL WITH GFR  3. Essential hypertension controlled  4. Hyperlipidemia, unspecified hyperlipidemia type Needs repeat test today - Lipid panel  5. Generalized anxiety disorder Stable - increasing paxil for report of anxious spells   Patient Instructions  Take the zyrtec once a day Take the tessalon for cough Drink plenty of water If the post nasal drip does not decrease add flonase  Take the paxil 20 mg once a day Refill the other medicines  NEED BLOOD WORK TODAY  See me in 3 months Call sooner fo rproblems   Raylene Everts, MD

## 2017-04-07 ENCOUNTER — Telehealth: Payer: Self-pay | Admitting: Family Medicine

## 2017-04-07 ENCOUNTER — Encounter: Payer: Self-pay | Admitting: Family Medicine

## 2017-04-07 NOTE — Telephone Encounter (Signed)
I have called Stephanie [name] and left her a message to call office.

## 2017-04-07 NOTE — Telephone Encounter (Signed)
Left message for Apollonia to call me

## 2017-04-07 NOTE — Telephone Encounter (Signed)
Stephanie [name], see if you can clarify this for me.  A lump the "size of her hand" is quite large, and would need a medical visit.

## 2017-04-07 NOTE — Telephone Encounter (Signed)
Patient requesting that I tell Dr. Francesca Oman nurse that the Rx she wrote for her did not go thru and they put them back.  She states she does have th cough medicine and it is helping.

## 2017-04-08 NOTE — Telephone Encounter (Signed)
Called [facility] and spoke to [place], states pt has picked up meds on 5 11 18, and has refills on her others. They are going to call her and clarify.

## 2017-04-09 ENCOUNTER — Telehealth: Payer: Self-pay | Admitting: Family Medicine

## 2017-04-09 MED ORDER — CYCLOBENZAPRINE HCL 5 MG PO TABS: 5.0000 mg | ORAL_TABLET | Freq: Three times a day (TID) | ORAL | 0 refills | Status: DC | PRN

## 2017-04-09 NOTE — Telephone Encounter (Signed)
May have cyclobenzaprine 5 mg TID as needed #20

## 2017-04-09 NOTE — Telephone Encounter (Signed)
Called [facility] , aware rx sent.

## 2017-04-09 NOTE — Telephone Encounter (Signed)
Spoke to [employer], states she has all the meds she needs from [facility].  Is asking for a muscle relaxer for back pain?

## 2017-04-09 NOTE — Telephone Encounter (Signed)
1. Patient left a vm on the phone yesterday 04/08/17 that [facility] did not have her medication rx's (not sure if this has already been handled, if so pls disregard)  Also,  2. Patient left a voicemail last night around 7pm, that she has hurt her back and needs muscle relaxers  Cb#: [phone]

## 2017-04-15 ENCOUNTER — Other Ambulatory Visit: Payer: Self-pay | Admitting: Family Medicine

## 2017-04-16 ENCOUNTER — Telehealth: Payer: Self-pay | Admitting: Family Medicine

## 2017-04-16 NOTE — Telephone Encounter (Signed)
Left message for Stephanie [name] to call office.

## 2017-04-16 NOTE — Telephone Encounter (Signed)
Patient states she is returning a call to [place], I couldn't see a msg in her chart to relay any information.  Cb#: [phone]

## 2017-04-16 NOTE — Telephone Encounter (Signed)
I had received a request for flexeril , which was filled 7 days ago . I called Stephanie [name], she seemed unsure of needing any meds. But then stated she was out.  Do you want to refill?

## 2017-04-17 MED ORDER — CYCLOBENZAPRINE HCL 5 MG PO TABS: 5.0000 mg | ORAL_TABLET | Freq: Three times a day (TID) | ORAL | 0 refills | Status: DC | PRN

## 2017-04-17 NOTE — Telephone Encounter (Signed)
Mar refill # 20 once, but let her know this is not an ongoing medicine, to be used as needed muscle spasm back

## 2017-04-17 NOTE — Telephone Encounter (Signed)
Done

## 2017-04-18 ENCOUNTER — Telehealth: Payer: Self-pay | Admitting: Family Medicine

## 2017-04-18 ENCOUNTER — Other Ambulatory Visit: Payer: Self-pay

## 2017-04-18 MED ORDER — BENZONATATE 100 MG PO CAPS: 100.0000 mg | ORAL_CAPSULE | Freq: Three times a day (TID) | ORAL | 0 refills | Status: DC | PRN

## 2017-04-18 NOTE — Telephone Encounter (Signed)
Per dr Meda Coffee rx sent for tessalon pearls, [place] aware.

## 2017-04-18 NOTE — Telephone Encounter (Signed)
Patient here in office now requesting to be seen for coughing.  She states that her husband has cancer and she cannot continue this coughing and further weakening his immune system . She would like to address other "things" but was not specific and states "I just wanna talk to Dr. Meda Coffee".  She is aware that Dr. Meda Coffee is not in office Monday or Tuesday.

## 2017-04-24 ENCOUNTER — Encounter: Payer: Self-pay | Admitting: Family Medicine

## 2017-04-24 ENCOUNTER — Ambulatory Visit (INDEPENDENT_AMBULATORY_CARE_PROVIDER_SITE_OTHER): Payer: Medicare Other | Admitting: Family Medicine

## 2017-04-24 VITALS — BP 110/68 | HR 100 | Temp 97.4°F | Resp 16 | Ht 62.0 in | Wt 153.1 lb

## 2017-04-24 DIAGNOSIS — R05 Cough: Secondary | ICD-10-CM | POA: Diagnosis not present

## 2017-04-24 DIAGNOSIS — R058 Other specified cough: Secondary | ICD-10-CM

## 2017-04-24 MED ORDER — BENZONATATE 200 MG PO CAPS: 200.0000 mg | ORAL_CAPSULE | Freq: Three times a day (TID) | ORAL | 0 refills | Status: DC | PRN

## 2017-04-24 MED ORDER — FLUTICASONE PROPIONATE 50 MCG/ACT NA SUSP: 2.0000 | Freq: Every day | NASAL | 6 refills | Status: DC

## 2017-04-24 NOTE — Progress Notes (Signed)
Chief Complaint  Patient presents with  . Follow-up    cough  here for essentially the same thing as prior visit Still with allergies, cough, PND Is taking zyrtec Tessalon 100 " not strong enough " Never started flonase Wants to see [facility] in ENT No fever or chills or purulent sputum  Patient Active Problem List   Diagnosis Date Noted  . 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 Chronic kidney disease 10/22/2016  . Generalized anxiety disorder 10/14/2016  . Chronic pain syndrome 10/14/2016  . Degenerative disc disease, cervical 10/14/2016  . Hemorrhoid   . Rectal bleeding 11/21/2015  . Constipation 09/20/2015  . Chest pain 09/19/2014  . HTN (hypertension) 09/19/2014  . Hyperlipidemia 09/19/2014  . Obesity 09/19/2014  . Radicular low back pain 03/21/2014  . Leg weakness, bilateral 03/21/2014    Outpatient Encounter Prescriptions as of 04/24/2017  Medication Sig  . ALPRAZolam (XANAX) 0.5 MG tablet Take 0.5 mg by mouth 2 (two) times daily as needed for anxiety (take as needed).   Marland Kitchen amLODipine (NORVASC) 5 MG tablet Take 1 tablet (5 mg total) by mouth daily.  . betamethasone dipropionate (DIPROLENE) 0.05 % cream Apply topically 2 (two) times daily.  Marland Kitchen buPROPion (WELLBUTRIN) 100 MG tablet Take 1 tablet by mouth 3 (three) times daily.  . cetirizine (ZYRTEC) 10 MG tablet Take 1 tablet (10 mg total) by mouth daily. For allergies  . cyclobenzaprine (FLEXERIL) 5 MG tablet Take 1 tablet (5 mg total) by mouth 3 (three) times daily as needed for muscle spasms.  Marland Kitchen dexlansoprazole (DEXILANT) 60 MG capsule Take 60 mg by mouth 2 (two) times daily.  Marland Kitchen docusate sodium (COLACE) 100 MG capsule Take 100 mg by mouth 2 (two) times daily.  Marland Kitchen lisinopril (PRINIVIL,ZESTRIL) 10 MG tablet TAKE 1 TABLET BY MOUTH ONCE DAILY FOR BLOOD PRESSURE.  . mirtazapine (REMERON) 15 MG tablet Take 45 mg by mouth at bedtime.   . mirtazapine (REMERON) 45 MG tablet TAKE 1 TABLET BY MOUTH AT BEDTIME FOR  SLEEP.  Marland Kitchen PARoxetine (PAXIL) 20 MG tablet Take 1 tablet (20 mg total) by mouth daily.  . simvastatin (ZOCOR) 20 MG tablet Take 1 tablet (20 mg total) by mouth daily.  . traMADol (ULTRAM) 50 MG tablet Take 50 mg by mouth 4 (four) times daily.  . [DISCONTINUED] benzonatate (TESSALON) 100 MG capsule Take 1 capsule (100 mg total) by mouth 3 (three) times daily as needed for cough.  . benzonatate (TESSALON) 200 MG capsule Take 1 capsule (200 mg total) by mouth 3 (three) times daily as needed for cough.  . fluticasone (FLONASE) 50 MCG/ACT nasal spray Place 2 sprays into both nostrils daily.   No facility-administered encounter medications on file as of 04/24/2017.     Allergies  Allergen Reactions  . Prednisone Anaphylaxis    "thinks" it was prednisone, says it was a shot    Review of Systems  Constitutional: Negative for chills, fever and unexpected weight change.  HENT: Positive for postnasal drip, rhinorrhea and voice change. Negative for congestion, dental problem, sinus pain, sinus pressure, sore throat and trouble swallowing.   Eyes: Negative for redness and visual disturbance.  Respiratory: Positive for cough. Negative for shortness of breath and wheezing.        Clear-white sputum  Cardiovascular: Negative for chest pain, palpitations and leg swelling.  Gastrointestinal: Negative for abdominal pain, constipation and diarrhea.  Genitourinary: Negative for difficulty urinating and frequency.  Musculoskeletal: Negative for arthralgias and back pain.       "  usual aches"  Neurological: Positive for headaches. Negative for dizziness.       Cough makes head hurt  Psychiatric/Behavioral: Negative for dysphoric mood and sleep disturbance. The patient is nervous/anxious and is hyperactive.       BP 110/68 (BP Location: Right Arm, Patient Position: Sitting, Cuff Size: Normal)   Pulse 100   Temp 97.4 F (36.3 C) (Temporal)   Resp 16   Ht 5\' 2"  (1.575 m)   Wt 153 lb 1.9 oz (69.5 kg)    SpO2 96%   BMI 28.01 kg/m   Physical Exam  Constitutional: She appears well-developed and well-nourished. No distress.  Mild hyperkinetic  HENT:  Head: Normocephalic and atraumatic.  Right Ear: External ear normal.  Left Ear: External ear normal.  Nose: Nose normal.  Mouth/Throat: Oropharynx is clear and moist.  No sinus tenderness  Eyes: Conjunctivae are normal. Pupils are equal, round, and reactive to light.  Neck: Normal range of motion.  Cardiovascular: Normal rate and regular rhythm.   Pulmonary/Chest: Effort normal. She has no wheezes.  Lungs clear  Musculoskeletal: Normal range of motion. She exhibits no edema.  Lymphadenopathy:    She has no cervical adenopathy.  Neurological: She is alert.  Skin: Skin is warm and dry.  Psychiatric: She has a normal mood and affect. Her behavior is normal.    ASSESSMENT/PLAN:  1. Allergic cough  - Ambulatory referral to ENT   Patient Instructions  Take the zyrtec daily Use the flonase daily Drink plenty of water Increase the tessalon to 200 mg as needed three times a day  See me at the usual 3 month visit    Raylene Everts, MD

## 2017-04-24 NOTE — Patient Instructions (Signed)
Take the zyrtec daily Use the flonase daily Drink plenty of water Increase the tessalon to 200 mg as needed three times a day  See me at the usual 3 month visit

## 2017-07-07 ENCOUNTER — Ambulatory Visit: Payer: Self-pay | Admitting: Family Medicine

## 2017-07-21 ENCOUNTER — Other Ambulatory Visit: Payer: Self-pay | Admitting: Family Medicine

## 2017-07-21 NOTE — Telephone Encounter (Signed)
Seen [phone]

## 2017-08-21 ENCOUNTER — Ambulatory Visit: Payer: Self-pay | Admitting: Family Medicine

## 2017-08-29 DIAGNOSIS — Z23 Encounter for immunization: Secondary | ICD-10-CM | POA: Diagnosis not present

## 2017-09-02 DIAGNOSIS — I10 Essential (primary) hypertension: Secondary | ICD-10-CM | POA: Diagnosis not present

## 2017-09-02 DIAGNOSIS — R079 Chest pain, unspecified: Secondary | ICD-10-CM | POA: Diagnosis not present

## 2017-09-02 DIAGNOSIS — R0602 Shortness of breath: Secondary | ICD-10-CM | POA: Diagnosis not present

## 2017-09-02 DIAGNOSIS — R0789 Other chest pain: Secondary | ICD-10-CM | POA: Diagnosis not present

## 2017-09-11 ENCOUNTER — Ambulatory Visit: Payer: Self-pay | Admitting: Family Medicine

## 2017-09-29 ENCOUNTER — Other Ambulatory Visit: Payer: Self-pay | Admitting: Family Medicine

## 2017-09-29 NOTE — Telephone Encounter (Signed)
Seen [phone]

## 2017-09-30 ENCOUNTER — Telehealth: Payer: Self-pay | Admitting: *Deleted

## 2017-09-30 NOTE — Telephone Encounter (Signed)
Called [place], her mail box is full and cannot take any messages.

## 2017-09-30 NOTE — Telephone Encounter (Signed)
Spoke to [employer], states her husband is sick and she wants something for her nerves.

## 2017-09-30 NOTE — Telephone Encounter (Signed)
She no showed for her last appointment.  Needs to come in.

## 2017-09-30 NOTE — Telephone Encounter (Signed)
Patient called stating she has been in [place] with her husband today, and patient is requesting something for her nerves. Patient states she leaves in the morning to go back to [facility]. Please advise

## 2017-09-30 NOTE — Telephone Encounter (Signed)
I called patient to let her know that she will need an appointment, no answer no vm.

## 2017-10-23 ENCOUNTER — Ambulatory Visit (INDEPENDENT_AMBULATORY_CARE_PROVIDER_SITE_OTHER): Payer: Medicare Other

## 2017-10-23 VITALS — BP 126/76 | HR 72 | Temp 98.1°F | Resp 16 | Ht 62.0 in | Wt 150.2 lb

## 2017-10-23 DIAGNOSIS — Z0000 Encounter for general adult medical examination without abnormal findings: Secondary | ICD-10-CM | POA: Diagnosis not present

## 2017-10-23 NOTE — Progress Notes (Addendum)
Subjective:   Stephanie [name] is a 61 y.o. female who presents for an Initial Medicare Annual Wellness Visit.  Review of Systems            Objective:    Today's Vitals   10/23/17 1311  BP: 126/76  Pulse: 72  Resp: 16  Temp: 98.1 F (36.7 C)  TempSrc: Other (Comment)  SpO2: 96%  Weight: 150 lb 4 oz (68.2 kg)  Height: 5\' 2"  (1.575 m)  PainSc: 0-No pain   Body mass index is 27.48 kg/m.   Current Medications (verified) Outpatient Encounter Medications as of 10/23/2017  Medication Sig  . amLODipine (NORVASC) 5 MG tablet Take 1 tablet (5 mg total) by mouth daily.  . betamethasone dipropionate (DIPROLENE) 0.05 % cream Apply topically 2 (two) times daily.  Marland Kitchen lisinopril (PRINIVIL,ZESTRIL) 10 MG tablet TAKE 1 TABLET BY MOUTH ONCE DAILY FOR BLOOD PRESSURE.  . mirtazapine (REMERON) 15 MG tablet Take 45 mg by mouth at bedtime.   . simvastatin (ZOCOR) 20 MG tablet Take 1 tablet (20 mg total) by mouth daily.  Marland Kitchen ALPRAZolam (XANAX) 0.5 MG tablet Take 0.5 mg by mouth 2 (two) times daily as needed for anxiety (take as needed).   . benzonatate (TESSALON) 200 MG capsule Take 1 capsule (200 mg total) by mouth 3 (three) times daily as needed for cough. (Patient not taking: Reported on 10/23/2017)  . buPROPion (WELLBUTRIN) 100 MG tablet Take 1 tablet by mouth 3 (three) times daily.  . cetirizine (ZYRTEC) 10 MG tablet Take 1 tablet (10 mg total) by mouth daily. For allergies (Patient not taking: Reported on 10/23/2017)  . cyclobenzaprine (FLEXERIL) 5 MG tablet Take 1 tablet (5 mg total) by mouth 3 (three) times daily as needed for muscle spasms. (Patient not taking: Reported on 10/23/2017)  . DEXILANT 60 MG capsule TAKE 2 CAPSULES BY MOUTH DAILY. (Patient not taking: Reported on 10/23/2017)  . docusate sodium (COLACE) 100 MG capsule Take 100 mg by mouth 2 (two) times daily.  . fluticasone (FLONASE) 50 MCG/ACT nasal spray Place 2 sprays into both nostrils daily. (Patient not taking: Reported on  10/23/2017)  . mirtazapine (REMERON) 45 MG tablet TAKE 1 TABLET BY MOUTH AT BEDTIME FOR SLEEP. (Patient not taking: Reported on 10/23/2017)  . PARoxetine (PAXIL) 20 MG tablet Take 1 tablet (20 mg total) by mouth daily. (Patient not taking: Reported on 10/23/2017)  . traMADol (ULTRAM) 50 MG tablet Take 50 mg by mouth 4 (four) times daily.   No facility-administered encounter medications on file as of 10/23/2017.     Allergies (verified) Prednisone   History: Past Medical History:  Diagnosis Date  . Allergy   . Anxiety   . Arthritis    degenerative disc disease neck and low back  . Chronic neck and back pain   . Chronic pain   . Depression   . GERD (gastroesophageal reflux disease)   . Hemorrhoids   . Hypercholesterolemia   . Hypertension   . Osteoporosis    Past Surgical History:  Procedure Laterality Date  . ABDOMINAL HYSTERECTOMY    . APPENDECTOMY    . CHOLECYSTECTOMY    . COLONOSCOPY  2009   Dr. Gala Romney: normal  . COLONOSCOPY WITH PROPOFOL N/A 12/18/2015   YQI:HKVQ canal/internal hemorrhoids otherwise normal  . ESOPHAGOGASTRODUODENOSCOPY  2009   Dr. Gala Romney: normal, small hiatal hernia  . HERNIA REPAIR     umbilical  . NECK SURGERY    . SPINE SURGERY  neck   Family History  Problem Relation Age of Onset  . Parkinson's disease Father        Deceased  . Colon cancer Father        diagnosed in his 47s   . Brain cancer Mother        Deceased  . Cancer Mother 49       brain  . COPD Sister   . Cancer Brother        skin   . Arthritis Brother        back problems  . Seizures Daughter   . Seizures Daughter    Social History   Occupational History  . Occupation: disabled    Comment: Chief Executive Officer  Tobacco Use  . Smoking status: Never Smoker  . Smokeless tobacco: Never Used  . Tobacco comment: Never smoked  Substance and Sexual Activity  . Alcohol use: Yes    Alcohol/week: 0.0 oz    Comment: social alcohol   . Drug use: No  . Sexual activity: Not  Currently    Birth control/protection: Surgical    Tobacco Counseling Counseling given: Not Answered Comment: Never smoked   Activities of Daily Living In your present state of health, do you have any difficulty performing the following activities: 10/23/2017 04/04/2017  Hearing? Y N  Vision? Y N  Difficulty concentrating or making decisions? Y N  Walking or climbing stairs? Y N  Dressing or bathing? N N  Doing errands, shopping? N N  Preparing Food and eating ? N -  Using the Toilet? N -  In the past six months, have you accidently leaked urine? Y -  Do you have problems with loss of bowel control? N -  Managing your Medications? N -  Managing your Finances? N -  Housekeeping or managing your Housekeeping? N -  Some recent data might be hidden    Immunizations and Health Maintenance Immunization History  Administered Date(s) Administered  . Influenza,inj,Quad PF,6+ Mos 09/20/2014, 10/14/2016  . Influenza-Unspecified 07/30/2017  . Pneumococcal Polysaccharide-23 10/22/2011   Health Maintenance Due  Topic Date Due  . Hepatitis C Screening  1956/02/08  . HIV Screening  05/26/1972  . TETANUS/TDAP  05/26/1976  . PAP SMEAR  05/26/1978  . MAMMOGRAM  04/25/2017    Patient Care Team: Stephanie Everts, MD as PCP - General (Family Medicine) Gala Romney Stephanie Estimable, MD as Consulting Physician (Gastroenterology)  Indicate any recent Medical Services you may have received from other than [facility] providers in the past year (date may be approximate).     Assessment:   This is a routine wellness examination for Stephanie [name].   Hearing/Vision screen No exam data present  Dietary issues and exercise activities discussed: Current Exercise Habits: Home exercise routine, Type of exercise: walking, Time (Minutes): 60, Frequency (Times/Week): 5, Weekly Exercise (Minutes/Week): 300, Intensity: Moderate  Goals    None     Depression Screen PHQ 2/9 Scores 10/23/2017 04/04/2017 10/29/2016  PHQ - 2  Score 6 0 0  PHQ- 9 Score 19 - -    Fall Risk Fall Risk  10/23/2017 04/04/2017 10/29/2016 10/14/2016  Falls in the past year? Yes No Yes Yes  Number falls in past yr: 1 - 2 or more 2 or more  Injury with Fall? Yes - Yes -    Cognitive Function:     6CIT Screen 10/23/2017  What Year? 0 points  What month? 0 points  What time? 0 points  Count back from 20 2  points  Months in reverse 4 points  Repeat phrase 2 points  Total Score 8    Screening Tests Health Maintenance  Topic Date Due  . Hepatitis C Screening  [birth year]  . HIV Screening  05/26/1972  . TETANUS/TDAP  05/26/1976  . PAP SMEAR  05/26/1978  . MAMMOGRAM  04/25/2017  . COLONOSCOPY  12/17/2025  . INFLUENZA VACCINE  Completed      Plan:     I have personally reviewed and noted the following in the patient's chart:   . Medical and social history . Use of alcohol, tobacco or illicit drugs  . Current medications and supplements . Functional ability and status . Nutritional status . Physical activity . Advanced directives . List of other physicians . Hospitalizations, surgeries, and ER visits in previous 12 months . Vitals . Screenings to include cognitive, depression, and falls . Referrals and appointments  In addition, I have reviewed and discussed with patient certain preventive protocols, quality metrics, and best practice recommendations. A written personalized care plan for preventive services as well as general preventive health recommendations were provided to patient.     Merceda Elks, LPN   [id number]

## 2017-10-23 NOTE — Patient Instructions (Signed)
Stephanie [name] , Thank you for taking time to come for your Medicare Wellness Visit. I appreciate your ongoing commitment to your health goals. Please review the following plan we discussed and let me know if I can assist you in the future.   Screening recommendations/referrals: Colonoscopy: 2027 Mammogram: Due Bone Density: Done Recommended yearly ophthalmology/optometry visit for glaucoma screening and checkup Recommended yearly dental visit for hygiene and checkup  Vaccinations: Influenza vaccine: Done Pneumococcal vaccine: Age 60 Tdap vaccine: Discuss with PCP Shingles vaccine: Discuss with PCP    Advanced directives: Discussed in office  Conditions/risks identified: None  Next appointment: Schedule your follow with Dr. Meda Coffee when you leave today.   Preventive Care 40-64 Years, Female Preventive care refers to lifestyle choices and visits with your health care provider that can promote health and wellness. What does preventive care include?  A yearly physical exam. This is also called an annual well check.  Dental exams once or twice a year.  Routine eye exams. Ask your health care provider how often you should have your eyes checked.  Personal lifestyle choices, including:  Daily care of your teeth and gums.  Regular physical activity.  Eating a healthy diet.  Avoiding tobacco and drug use.  Limiting alcohol use.  Practicing safe sex.  Taking low-dose aspirin daily starting at age 47.  Taking vitamin and mineral supplements as recommended by your health care provider. What happens during an annual well check? The services and screenings done by your health care provider during your annual well check will depend on your age, overall health, lifestyle risk factors, and family history of disease. Counseling  Your health care provider may ask you questions about your:  Alcohol use.  Tobacco use.  Drug use.  Emotional well-being.  Home and relationship  well-being.  Sexual activity.  Eating habits.  Work and work Statistician.  Method of birth control.  Menstrual cycle.  Pregnancy history. Screening  You may have the following tests or measurements:  Height, weight, and BMI.  Blood pressure.  Lipid and cholesterol levels. These may be checked every 5 years, or more frequently if you are over 31 years old.  Skin check.  Lung cancer screening. You may have this screening every year starting at age 5 if you have a 30-pack-year history of smoking and currently smoke or have quit within the past 15 years.  Fecal occult blood test (FOBT) of the stool. You may have this test every year starting at age 40.  Flexible sigmoidoscopy or colonoscopy. You may have a sigmoidoscopy every 5 years or a colonoscopy every 10 years starting at age 59.  Hepatitis C blood test.  Hepatitis B blood test.  Sexually transmitted disease (STD) testing.  Diabetes screening. This is done by checking your blood sugar (glucose) after you have not eaten for a while (fasting). You may have this done every 1-3 years.  Mammogram. This may be done every 1-2 years. Talk to your health care provider about when you should start having regular mammograms. This may depend on whether you have a family history of breast cancer.  BRCA-related cancer screening. This may be done if you have a family history of breast, ovarian, tubal, or peritoneal cancers.  Pelvic exam and Pap test. This may be done every 3 years starting at age 41. Starting at age 64, this may be done every 5 years if you have a Pap test in combination with an HPV test.  Bone density scan. This is done  to screen for osteoporosis. You may have this scan if you are at high risk for osteoporosis. Discuss your test results, treatment options, and if necessary, the need for more tests with your health care provider. Vaccines  Your health care provider may recommend certain vaccines, such  as:  Influenza vaccine. This is recommended every year.  Tetanus, diphtheria, and acellular pertussis (Tdap, Td) vaccine. You may need a Td booster every 10 years.  Zoster vaccine. You may need this after age 61.  Pneumococcal 13-valent conjugate (PCV13) vaccine. You may need this if you have certain conditions and were not previously vaccinated.  Pneumococcal polysaccharide (PPSV23) vaccine. You may need one or two doses if you smoke cigarettes or if you have certain conditions. Talk to your health care provider about which screenings and vaccines you need and how often you need them. This information is not intended to replace advice given to you by your health care provider. Make sure you discuss any questions you have with your health care provider. Document Released: 12/08/2015 Document Revised: 07/31/2016 Document Reviewed: 09/12/2015 Elsevier Interactive Patient Education  2017 [place] Prevention in the Home Falls can cause injuries. They can happen to people of all ages. There are many things you can do to make your home safe and to help prevent falls. What can I do on the outside of my home?  Regularly fix the edges of walkways and driveways and fix any cracks.  Remove anything that might make you trip as you walk through a door, such as a raised step or threshold.  Trim any bushes or trees on the path to your home.  Use bright outdoor lighting.  Clear any walking paths of anything that might make someone trip, such as rocks or tools.  Regularly check to see if handrails are loose or broken. Make sure that both sides of any steps have handrails.  Any raised decks and porches should have guardrails on the edges.  Have any leaves, snow, or ice cleared regularly.  Use sand or salt on walking paths during winter.  Clean up any spills in your garage right away. This includes oil or grease spills. What can I do in the bathroom?  Use night  lights.  Install grab bars by the toilet and in the tub and shower. Do not use towel bars as grab bars.  Use non-skid mats or decals in the tub or shower.  If you need to sit down in the shower, use a plastic, non-slip stool.  Keep the floor dry. Clean up any water that spills on the floor as soon as it happens.  Remove soap buildup in the tub or shower regularly.  Attach bath mats securely with double-sided non-slip rug tape.  Do not have throw rugs and other things on the floor that can make you trip. What can I do in the bedroom?  Use night lights.  Make sure that you have a light by your bed that is easy to reach.  Do not use any sheets or blankets that are too big for your bed. They should not hang down onto the floor.  Have a firm chair that has side arms. You can use this for support while you get dressed.  Do not have throw rugs and other things on the floor that can make you trip. What can I do in the kitchen?  Clean up any spills right away.  Avoid walking on wet floors.  Keep items that  you use a lot in easy-to-reach places.  If you need to reach something above you, use a strong step stool that has a grab bar.  Keep electrical cords out of the way.  Do not use floor polish or wax that makes floors slippery. If you must use wax, use non-skid floor wax.  Do not have throw rugs and other things on the floor that can make you trip. What can I do with my stairs?  Do not leave any items on the stairs.  Make sure that there are handrails on both sides of the stairs and use them. Fix handrails that are broken or loose. Make sure that handrails are as long as the stairways.  Check any carpeting to make sure that it is firmly attached to the stairs. Fix any carpet that is loose or worn.  Avoid having throw rugs at the top or bottom of the stairs. If you do have throw rugs, attach them to the floor with carpet tape.  Make sure that you have a light switch at the  top of the stairs and the bottom of the stairs. If you do not have them, ask someone to add them for you. What else can I do to help prevent falls?  Wear shoes that:  Do not have high heels.  Have rubber bottoms.  Are comfortable and fit you well.  Are closed at the toe. Do not wear sandals.  If you use a stepladder:  Make sure that it is fully opened. Do not climb a closed stepladder.  Make sure that both sides of the stepladder are locked into place.  Ask someone to hold it for you, if possible.  Clearly mark and make sure that you can see:  Any grab bars or handrails.  First and last steps.  Where the edge of each step is.  Use tools that help you move around (mobility aids) if they are needed. These include:  Canes.  Walkers.  Scooters.  Crutches.  Turn on the lights when you go into a dark area. Replace any light bulbs as soon as they burn out.  Set up your furniture so you have a clear path. Avoid moving your furniture around.  If any of your floors are uneven, fix them.  If there are any pets around you, be aware of where they are.  Review your medicines with your doctor. Some medicines can make you feel dizzy. This can increase your chance of falling. Ask your doctor what other things that you can do to help prevent falls. This information is not intended to replace advice given to you by your health care provider. Make sure you discuss any questions you have with your health care provider. Document Released: 09/07/2009 Document Revised: 04/18/2016 Document Reviewed: 12/16/2014 Elsevier Interactive Patient Education  2017 [place] for Adults  A healthy lifestyle and preventive care can promote health and wellness. Preventive health guidelines for adults include the following key practices.  . A routine yearly physical is a good way to check with your health care provider about your health and preventive screening. It is a chance to  share any concerns and updates on your health and to receive a thorough exam.  . Visit your dentist for a routine exam and preventive care every 6 months. Brush your teeth twice a day and floss once a day. Good oral hygiene prevents tooth decay and gum disease.  . The frequency of eye exams is based on your  age, health, family medical history, use  of contact lenses, and other factors. Follow your health care provider's ecommendations for frequency of eye exams.  . Eat a healthy diet. Foods like vegetables, fruits, whole grains, low-fat dairy products, and lean protein foods contain the nutrients you need without too many calories. Decrease your intake of foods high in solid fats, added sugars, and salt. Eat the right amount of calories for you. Get information about a proper diet from your health care provider, if necessary.  . Regular physical exercise is one of the most important things you can do for your health. Most adults should get at least 150 minutes of moderate-intensity exercise (any activity that increases your heart rate and causes you to sweat) each week. In addition, most adults need muscle-strengthening exercises on 2 or more days a week.  Silver Sneakers may be a benefit available to you. To determine eligibility, you may visit the website: www.silversneakers.com or contact program at 412 099 8757 Mon-Fri between 8AM-8PM.   . Maintain a healthy weight. The body mass index (BMI) is a screening tool to identify possible weight problems. It provides an estimate of body fat based on height and weight. Your health care provider can find your BMI and can help you achieve or maintain a healthy weight.   For adults 20 years and older: ? A BMI below 18.5 is considered underweight. ? A BMI of 18.5 to 24.9 is normal. ? A BMI of 25 to 29.9 is considered overweight. ? A BMI of 30 and above is considered obese.   . Maintain normal blood lipids and cholesterol levels by exercising and  minimizing your intake of saturated fat. Eat a balanced diet with plenty of fruit and vegetables. Blood tests for lipids and cholesterol should begin at age 49 and be repeated every 5 years. If your lipid or cholesterol levels are high, you are over 50, or you are at high risk for heart disease, you may need your cholesterol levels checked more frequently. Ongoing high lipid and cholesterol levels should be treated with medicines if diet and exercise are not working.  . If you smoke, find out from your health care provider how to quit. If you do not use tobacco, please do not start.  . If you choose to drink alcohol, please do not consume more than 2 drinks per day. One drink is considered to be 12 ounces (355 mL) of beer, 5 ounces (148 mL) of wine, or 1.5 ounces (44 mL) of liquor.  . If you are 46-5 years old, ask your health care provider if you should take aspirin to prevent strokes.  . Use sunscreen. Apply sunscreen liberally and repeatedly throughout the day. You should seek shade when your shadow is shorter than you. Protect yourself by wearing long sleeves, pants, a wide-brimmed hat, and sunglasses year round, whenever you are outdoors.  . Once a month, do a whole body skin exam, using a mirror to look at the skin on your back. Tell your health care provider of new moles, moles that have irregular borders, moles that are larger than a pencil eraser, or moles that have changed in shape or color.

## 2017-11-04 ENCOUNTER — Other Ambulatory Visit: Payer: Self-pay

## 2017-11-05 ENCOUNTER — Telehealth: Payer: Self-pay | Admitting: Family Medicine

## 2017-11-05 NOTE — Telephone Encounter (Signed)
Pt is having anxiety husband is in ICU [facility] (3 months Now) Heart palpatation, and pt has been seen in the Emergency Room , needs the Dr to call her in something or work out a time to be seen.

## 2017-11-06 NOTE — Telephone Encounter (Signed)
I called and talked to Stephanie [name], and made her an Appt for Jan 3

## 2017-11-06 NOTE — Telephone Encounter (Signed)
We asked her to make an appointment 5 weeks ago, and she has not come in.  I do not have openings today.  Give her my next availability.

## 2017-11-11 DIAGNOSIS — M545 Low back pain: Secondary | ICD-10-CM | POA: Diagnosis not present

## 2017-11-11 DIAGNOSIS — I951 Orthostatic hypotension: Secondary | ICD-10-CM | POA: Diagnosis not present

## 2017-11-11 DIAGNOSIS — F419 Anxiety disorder, unspecified: Secondary | ICD-10-CM | POA: Diagnosis not present

## 2017-11-11 DIAGNOSIS — G4089 Other seizures: Secondary | ICD-10-CM | POA: Diagnosis not present

## 2017-11-17 ENCOUNTER — Telehealth: Payer: Self-pay | Admitting: Family Medicine

## 2017-11-17 NOTE — Telephone Encounter (Signed)
Please call Stephanie [name].  If she is having chest discomfort she needs to go to the ER

## 2017-11-17 NOTE — Telephone Encounter (Signed)
Pt left a msg on machine that her chest was heavy, and she wanted Dr Meda Coffee to know

## 2017-11-17 NOTE — Telephone Encounter (Signed)
Spoke to [employer], states she was evaluated in ED in [place], is unsure of the date when. She said they told her "she was not having a heart attack and to call her dr to figure out why she is having chest pain" pt does not feel she needs to go to ed. Has an appt 11/27/16 with dr Meda Coffee.

## 2017-11-17 NOTE — Telephone Encounter (Signed)
I have attempted to call, no answer.

## 2017-11-27 ENCOUNTER — Ambulatory Visit: Payer: Self-pay | Admitting: Family Medicine

## 2017-12-05 ENCOUNTER — Other Ambulatory Visit: Payer: Self-pay | Admitting: Family Medicine

## 2017-12-08 ENCOUNTER — Telehealth: Payer: Self-pay

## 2017-12-08 NOTE — Telephone Encounter (Signed)
Thanks.  I will get in touch

## 2017-12-08 NOTE — Telephone Encounter (Signed)
Stephanie [name] called to let you know her husband passed away.

## 2017-12-17 ENCOUNTER — Other Ambulatory Visit: Payer: Self-pay | Admitting: Family Medicine

## 2017-12-18 ENCOUNTER — Other Ambulatory Visit (HOSPITAL_COMMUNITY): Payer: Self-pay | Admitting: Neurology

## 2017-12-18 ENCOUNTER — Telehealth: Payer: Self-pay | Admitting: Family Medicine

## 2017-12-18 ENCOUNTER — Ambulatory Visit (HOSPITAL_COMMUNITY)
Admission: RE | Admit: 2017-12-18 | Discharge: 2017-12-18 | Disposition: A | Discharge status: Home / Self Care | Payer: Medicare Other | Source: Ambulatory Visit | Attending: Neurology | Admitting: Neurology

## 2017-12-18 DIAGNOSIS — M5136 Other intervertebral disc degeneration, lumbar region: Secondary | ICD-10-CM | POA: Diagnosis not present

## 2017-12-18 DIAGNOSIS — R52 Pain, unspecified: Secondary | ICD-10-CM

## 2017-12-18 DIAGNOSIS — M545 Low back pain: Secondary | ICD-10-CM | POA: Diagnosis not present

## 2017-12-18 NOTE — Telephone Encounter (Signed)
Stephanie [name] called in and states that she knows that she was dropped  But she told the MRI place that Dr Meda Coffee was her provider. Please send any notes to [facility]

## 2017-12-19 NOTE — Telephone Encounter (Signed)
I do not see anything to send to Dr. Merlene Laughter

## 2017-12-22 ENCOUNTER — Telehealth: Payer: Self-pay | Admitting: Family Medicine

## 2017-12-22 NOTE — Telephone Encounter (Signed)
Patient left voicemail asking if we received her xray results. I called her back to let her know that Dr.Nelson is out of the office and she would need to contact the ordering physician.  Cb#: [phone]

## 2018-01-09 ENCOUNTER — Other Ambulatory Visit: Payer: Self-pay | Admitting: Family Medicine

## 2018-01-20 DIAGNOSIS — F419 Anxiety disorder, unspecified: Secondary | ICD-10-CM | POA: Diagnosis not present

## 2018-01-20 DIAGNOSIS — G4089 Other seizures: Secondary | ICD-10-CM | POA: Diagnosis not present

## 2018-01-20 DIAGNOSIS — M545 Low back pain: Secondary | ICD-10-CM | POA: Diagnosis not present

## 2018-01-20 DIAGNOSIS — I951 Orthostatic hypotension: Secondary | ICD-10-CM | POA: Diagnosis not present

## 2018-03-02 ENCOUNTER — Ambulatory Visit (INDEPENDENT_AMBULATORY_CARE_PROVIDER_SITE_OTHER): Payer: Self-pay | Admitting: Otolaryngology

## 2018-03-18 DIAGNOSIS — I951 Orthostatic hypotension: Secondary | ICD-10-CM | POA: Diagnosis not present

## 2018-03-18 DIAGNOSIS — M545 Low back pain: Secondary | ICD-10-CM | POA: Diagnosis not present

## 2018-03-18 DIAGNOSIS — G4089 Other seizures: Secondary | ICD-10-CM | POA: Diagnosis not present

## 2018-03-18 DIAGNOSIS — F419 Anxiety disorder, unspecified: Secondary | ICD-10-CM | POA: Diagnosis not present

## 2018-04-14 DIAGNOSIS — K645 Perianal venous thrombosis: Secondary | ICD-10-CM | POA: Diagnosis not present

## 2018-04-27 DIAGNOSIS — Z6827 Body mass index (BMI) 27.0-27.9, adult: Secondary | ICD-10-CM | POA: Diagnosis not present

## 2018-04-27 DIAGNOSIS — K219 Gastro-esophageal reflux disease without esophagitis: Secondary | ICD-10-CM | POA: Diagnosis not present

## 2018-04-27 DIAGNOSIS — F419 Anxiety disorder, unspecified: Secondary | ICD-10-CM | POA: Diagnosis not present

## 2018-04-27 DIAGNOSIS — G4700 Insomnia, unspecified: Secondary | ICD-10-CM | POA: Diagnosis not present

## 2018-04-27 DIAGNOSIS — F39 Unspecified mood [affective] disorder: Secondary | ICD-10-CM | POA: Diagnosis not present

## 2018-04-27 DIAGNOSIS — I10 Essential (primary) hypertension: Secondary | ICD-10-CM | POA: Diagnosis not present

## 2018-04-27 DIAGNOSIS — E782 Mixed hyperlipidemia: Secondary | ICD-10-CM | POA: Diagnosis not present

## 2018-04-28 ENCOUNTER — Ambulatory Visit: Payer: Medicare HMO | Admitting: General Surgery

## 2018-05-19 DIAGNOSIS — G4089 Other seizures: Secondary | ICD-10-CM | POA: Diagnosis not present

## 2018-05-19 DIAGNOSIS — M545 Low back pain: Secondary | ICD-10-CM | POA: Diagnosis not present

## 2018-05-19 DIAGNOSIS — F419 Anxiety disorder, unspecified: Secondary | ICD-10-CM | POA: Diagnosis not present

## 2018-05-19 DIAGNOSIS — I951 Orthostatic hypotension: Secondary | ICD-10-CM | POA: Diagnosis not present

## 2018-05-26 DIAGNOSIS — I10 Essential (primary) hypertension: Secondary | ICD-10-CM | POA: Diagnosis not present

## 2018-05-26 DIAGNOSIS — E782 Mixed hyperlipidemia: Secondary | ICD-10-CM | POA: Diagnosis not present

## 2018-05-30 DIAGNOSIS — Z6828 Body mass index (BMI) 28.0-28.9, adult: Secondary | ICD-10-CM | POA: Diagnosis not present

## 2018-05-30 DIAGNOSIS — R600 Localized edema: Secondary | ICD-10-CM | POA: Diagnosis not present

## 2018-05-30 DIAGNOSIS — L568 Other specified acute skin changes due to ultraviolet radiation: Secondary | ICD-10-CM | POA: Diagnosis not present

## 2018-06-05 DIAGNOSIS — I10 Essential (primary) hypertension: Secondary | ICD-10-CM | POA: Diagnosis not present

## 2018-06-05 DIAGNOSIS — K219 Gastro-esophageal reflux disease without esophagitis: Secondary | ICD-10-CM | POA: Diagnosis not present

## 2018-06-05 DIAGNOSIS — G4700 Insomnia, unspecified: Secondary | ICD-10-CM | POA: Diagnosis not present

## 2018-06-05 DIAGNOSIS — Z6827 Body mass index (BMI) 27.0-27.9, adult: Secondary | ICD-10-CM | POA: Diagnosis not present

## 2018-06-05 DIAGNOSIS — E782 Mixed hyperlipidemia: Secondary | ICD-10-CM | POA: Diagnosis not present

## 2018-06-05 DIAGNOSIS — R251 Tremor, unspecified: Secondary | ICD-10-CM | POA: Diagnosis not present

## 2018-06-05 DIAGNOSIS — F419 Anxiety disorder, unspecified: Secondary | ICD-10-CM | POA: Diagnosis not present

## 2018-06-05 DIAGNOSIS — F39 Unspecified mood [affective] disorder: Secondary | ICD-10-CM | POA: Diagnosis not present

## 2018-06-05 DIAGNOSIS — R944 Abnormal results of kidney function studies: Secondary | ICD-10-CM | POA: Diagnosis not present

## 2018-06-17 DIAGNOSIS — H25813 Combined forms of age-related cataract, bilateral: Secondary | ICD-10-CM | POA: Diagnosis not present

## 2018-06-17 DIAGNOSIS — H5203 Hypermetropia, bilateral: Secondary | ICD-10-CM | POA: Diagnosis not present

## 2018-06-17 DIAGNOSIS — H52221 Regular astigmatism, right eye: Secondary | ICD-10-CM | POA: Diagnosis not present

## 2018-06-17 DIAGNOSIS — D3131 Benign neoplasm of right choroid: Secondary | ICD-10-CM | POA: Diagnosis not present

## 2018-06-22 DIAGNOSIS — H25811 Combined forms of age-related cataract, right eye: Secondary | ICD-10-CM | POA: Diagnosis not present

## 2018-06-22 DIAGNOSIS — H25812 Combined forms of age-related cataract, left eye: Secondary | ICD-10-CM | POA: Diagnosis not present

## 2018-06-22 DIAGNOSIS — H04123 Dry eye syndrome of bilateral lacrimal glands: Secondary | ICD-10-CM | POA: Diagnosis not present

## 2018-06-22 DIAGNOSIS — H52223 Regular astigmatism, bilateral: Secondary | ICD-10-CM | POA: Diagnosis not present

## 2018-06-22 DIAGNOSIS — H25813 Combined forms of age-related cataract, bilateral: Secondary | ICD-10-CM | POA: Diagnosis not present

## 2018-07-02 DIAGNOSIS — H25812 Combined forms of age-related cataract, left eye: Secondary | ICD-10-CM | POA: Diagnosis not present

## 2018-07-02 DIAGNOSIS — H2512 Age-related nuclear cataract, left eye: Secondary | ICD-10-CM | POA: Diagnosis not present

## 2018-07-23 DIAGNOSIS — H2511 Age-related nuclear cataract, right eye: Secondary | ICD-10-CM | POA: Diagnosis not present

## 2018-07-23 DIAGNOSIS — H25811 Combined forms of age-related cataract, right eye: Secondary | ICD-10-CM | POA: Diagnosis not present

## 2018-08-25 DIAGNOSIS — H02413 Mechanical ptosis of bilateral eyelids: Secondary | ICD-10-CM | POA: Diagnosis not present

## 2018-08-26 DIAGNOSIS — Z09 Encounter for follow-up examination after completed treatment for conditions other than malignant neoplasm: Secondary | ICD-10-CM | POA: Diagnosis not present

## 2018-08-26 DIAGNOSIS — Z01818 Encounter for other preprocedural examination: Secondary | ICD-10-CM | POA: Diagnosis not present

## 2018-08-26 DIAGNOSIS — H02413 Mechanical ptosis of bilateral eyelids: Secondary | ICD-10-CM | POA: Diagnosis not present

## 2018-09-16 DIAGNOSIS — H02412 Mechanical ptosis of left eyelid: Secondary | ICD-10-CM | POA: Diagnosis not present

## 2018-09-16 DIAGNOSIS — H02411 Mechanical ptosis of right eyelid: Secondary | ICD-10-CM | POA: Diagnosis not present

## 2018-09-16 DIAGNOSIS — H02831 Dermatochalasis of right upper eyelid: Secondary | ICD-10-CM | POA: Diagnosis not present

## 2018-09-16 DIAGNOSIS — H02413 Mechanical ptosis of bilateral eyelids: Secondary | ICD-10-CM | POA: Diagnosis not present

## 2018-12-04 DIAGNOSIS — K219 Gastro-esophageal reflux disease without esophagitis: Secondary | ICD-10-CM | POA: Diagnosis not present

## 2018-12-04 DIAGNOSIS — D510 Vitamin B12 deficiency anemia due to intrinsic factor deficiency: Secondary | ICD-10-CM | POA: Diagnosis not present

## 2018-12-04 DIAGNOSIS — F1320 Sedative, hypnotic or anxiolytic dependence, uncomplicated: Secondary | ICD-10-CM | POA: Diagnosis not present

## 2018-12-04 DIAGNOSIS — R7303 Prediabetes: Secondary | ICD-10-CM | POA: Diagnosis not present

## 2018-12-04 DIAGNOSIS — F5105 Insomnia due to other mental disorder: Secondary | ICD-10-CM | POA: Diagnosis not present

## 2018-12-04 DIAGNOSIS — R251 Tremor, unspecified: Secondary | ICD-10-CM | POA: Diagnosis not present

## 2018-12-04 DIAGNOSIS — F419 Anxiety disorder, unspecified: Secondary | ICD-10-CM | POA: Diagnosis not present

## 2018-12-04 DIAGNOSIS — F39 Unspecified mood [affective] disorder: Secondary | ICD-10-CM | POA: Diagnosis not present

## 2018-12-04 DIAGNOSIS — E559 Vitamin D deficiency, unspecified: Secondary | ICD-10-CM | POA: Diagnosis not present

## 2018-12-04 DIAGNOSIS — Z6827 Body mass index (BMI) 27.0-27.9, adult: Secondary | ICD-10-CM | POA: Diagnosis not present

## 2018-12-04 DIAGNOSIS — R944 Abnormal results of kidney function studies: Secondary | ICD-10-CM | POA: Diagnosis not present

## 2018-12-04 DIAGNOSIS — I10 Essential (primary) hypertension: Secondary | ICD-10-CM | POA: Diagnosis not present

## 2018-12-04 DIAGNOSIS — G4700 Insomnia, unspecified: Secondary | ICD-10-CM | POA: Diagnosis not present

## 2018-12-04 DIAGNOSIS — N183 Chronic kidney disease, stage 3 (moderate): Secondary | ICD-10-CM | POA: Diagnosis not present

## 2018-12-04 DIAGNOSIS — E782 Mixed hyperlipidemia: Secondary | ICD-10-CM | POA: Diagnosis not present

## 2018-12-04 DIAGNOSIS — R7301 Impaired fasting glucose: Secondary | ICD-10-CM | POA: Diagnosis not present

## 2018-12-15 DIAGNOSIS — M25511 Pain in right shoulder: Secondary | ICD-10-CM | POA: Diagnosis not present

## 2018-12-15 DIAGNOSIS — R252 Cramp and spasm: Secondary | ICD-10-CM | POA: Diagnosis not present

## 2018-12-15 DIAGNOSIS — M545 Low back pain: Secondary | ICD-10-CM | POA: Diagnosis not present

## 2018-12-15 DIAGNOSIS — F331 Major depressive disorder, recurrent, moderate: Secondary | ICD-10-CM | POA: Diagnosis not present

## 2018-12-15 DIAGNOSIS — R251 Tremor, unspecified: Secondary | ICD-10-CM | POA: Diagnosis not present

## 2018-12-15 DIAGNOSIS — M25512 Pain in left shoulder: Secondary | ICD-10-CM | POA: Diagnosis not present

## 2019-01-07 DIAGNOSIS — I10 Essential (primary) hypertension: Secondary | ICD-10-CM | POA: Diagnosis not present

## 2019-01-07 DIAGNOSIS — F331 Major depressive disorder, recurrent, moderate: Secondary | ICD-10-CM | POA: Diagnosis not present

## 2019-01-07 DIAGNOSIS — K219 Gastro-esophageal reflux disease without esophagitis: Secondary | ICD-10-CM | POA: Diagnosis not present

## 2019-01-07 DIAGNOSIS — R7301 Impaired fasting glucose: Secondary | ICD-10-CM | POA: Diagnosis not present

## 2019-01-07 DIAGNOSIS — N183 Chronic kidney disease, stage 3 (moderate): Secondary | ICD-10-CM | POA: Diagnosis not present

## 2019-02-03 DIAGNOSIS — R05 Cough: Secondary | ICD-10-CM | POA: Diagnosis not present

## 2019-02-03 DIAGNOSIS — J019 Acute sinusitis, unspecified: Secondary | ICD-10-CM | POA: Diagnosis not present

## 2019-02-09 DIAGNOSIS — R7303 Prediabetes: Secondary | ICD-10-CM | POA: Diagnosis not present

## 2019-02-09 DIAGNOSIS — R7301 Impaired fasting glucose: Secondary | ICD-10-CM | POA: Diagnosis not present

## 2019-02-09 DIAGNOSIS — I10 Essential (primary) hypertension: Secondary | ICD-10-CM | POA: Diagnosis not present

## 2019-02-09 DIAGNOSIS — N183 Chronic kidney disease, stage 3 (moderate): Secondary | ICD-10-CM | POA: Diagnosis not present

## 2019-02-09 DIAGNOSIS — E782 Mixed hyperlipidemia: Secondary | ICD-10-CM | POA: Diagnosis not present

## 2019-02-09 DIAGNOSIS — K219 Gastro-esophageal reflux disease without esophagitis: Secondary | ICD-10-CM | POA: Diagnosis not present

## 2019-02-24 DIAGNOSIS — R7303 Prediabetes: Secondary | ICD-10-CM | POA: Diagnosis not present

## 2019-02-24 DIAGNOSIS — N183 Chronic kidney disease, stage 3 (moderate): Secondary | ICD-10-CM | POA: Diagnosis not present

## 2019-02-24 DIAGNOSIS — R7301 Impaired fasting glucose: Secondary | ICD-10-CM | POA: Diagnosis not present

## 2019-02-24 DIAGNOSIS — E782 Mixed hyperlipidemia: Secondary | ICD-10-CM | POA: Diagnosis not present

## 2019-02-24 DIAGNOSIS — K219 Gastro-esophageal reflux disease without esophagitis: Secondary | ICD-10-CM | POA: Diagnosis not present

## 2019-02-24 DIAGNOSIS — I10 Essential (primary) hypertension: Secondary | ICD-10-CM | POA: Diagnosis not present

## 2019-03-12 DIAGNOSIS — R609 Edema, unspecified: Secondary | ICD-10-CM | POA: Diagnosis not present

## 2019-03-30 DIAGNOSIS — E782 Mixed hyperlipidemia: Secondary | ICD-10-CM | POA: Diagnosis not present

## 2019-03-30 DIAGNOSIS — N183 Chronic kidney disease, stage 3 (moderate): Secondary | ICD-10-CM | POA: Diagnosis not present

## 2019-03-30 DIAGNOSIS — I10 Essential (primary) hypertension: Secondary | ICD-10-CM | POA: Diagnosis not present

## 2019-04-28 DIAGNOSIS — E782 Mixed hyperlipidemia: Secondary | ICD-10-CM | POA: Diagnosis not present

## 2019-04-28 DIAGNOSIS — I10 Essential (primary) hypertension: Secondary | ICD-10-CM | POA: Diagnosis not present

## 2019-04-28 DIAGNOSIS — R7303 Prediabetes: Secondary | ICD-10-CM | POA: Diagnosis not present

## 2019-04-28 DIAGNOSIS — K219 Gastro-esophageal reflux disease without esophagitis: Secondary | ICD-10-CM | POA: Diagnosis not present

## 2019-04-28 DIAGNOSIS — R7301 Impaired fasting glucose: Secondary | ICD-10-CM | POA: Diagnosis not present

## 2019-04-28 DIAGNOSIS — N183 Chronic kidney disease, stage 3 (moderate): Secondary | ICD-10-CM | POA: Diagnosis not present

## 2019-05-26 DIAGNOSIS — E782 Mixed hyperlipidemia: Secondary | ICD-10-CM | POA: Diagnosis not present

## 2019-05-26 DIAGNOSIS — R7303 Prediabetes: Secondary | ICD-10-CM | POA: Diagnosis not present

## 2019-05-26 DIAGNOSIS — K219 Gastro-esophageal reflux disease without esophagitis: Secondary | ICD-10-CM | POA: Diagnosis not present

## 2019-05-26 DIAGNOSIS — N183 Chronic kidney disease, stage 3 (moderate): Secondary | ICD-10-CM | POA: Diagnosis not present

## 2019-05-26 DIAGNOSIS — R7301 Impaired fasting glucose: Secondary | ICD-10-CM | POA: Diagnosis not present

## 2019-05-26 DIAGNOSIS — I10 Essential (primary) hypertension: Secondary | ICD-10-CM | POA: Diagnosis not present

## 2019-06-28 DIAGNOSIS — N183 Chronic kidney disease, stage 3 (moderate): Secondary | ICD-10-CM | POA: Diagnosis not present

## 2019-06-28 DIAGNOSIS — R7303 Prediabetes: Secondary | ICD-10-CM | POA: Diagnosis not present

## 2019-06-28 DIAGNOSIS — R7301 Impaired fasting glucose: Secondary | ICD-10-CM | POA: Diagnosis not present

## 2019-06-28 DIAGNOSIS — K219 Gastro-esophageal reflux disease without esophagitis: Secondary | ICD-10-CM | POA: Diagnosis not present

## 2019-06-28 DIAGNOSIS — I10 Essential (primary) hypertension: Secondary | ICD-10-CM | POA: Diagnosis not present

## 2019-06-28 DIAGNOSIS — E782 Mixed hyperlipidemia: Secondary | ICD-10-CM | POA: Diagnosis not present

## 2019-08-10 DIAGNOSIS — R7303 Prediabetes: Secondary | ICD-10-CM | POA: Diagnosis not present

## 2019-08-10 DIAGNOSIS — R7301 Impaired fasting glucose: Secondary | ICD-10-CM | POA: Diagnosis not present

## 2019-08-10 DIAGNOSIS — N183 Chronic kidney disease, stage 3 (moderate): Secondary | ICD-10-CM | POA: Diagnosis not present

## 2019-08-10 DIAGNOSIS — E782 Mixed hyperlipidemia: Secondary | ICD-10-CM | POA: Diagnosis not present

## 2019-08-10 DIAGNOSIS — K219 Gastro-esophageal reflux disease without esophagitis: Secondary | ICD-10-CM | POA: Diagnosis not present

## 2019-08-10 DIAGNOSIS — I10 Essential (primary) hypertension: Secondary | ICD-10-CM | POA: Diagnosis not present

## 2019-08-28 DIAGNOSIS — R7303 Prediabetes: Secondary | ICD-10-CM | POA: Diagnosis not present

## 2019-08-28 DIAGNOSIS — K219 Gastro-esophageal reflux disease without esophagitis: Secondary | ICD-10-CM | POA: Diagnosis not present

## 2019-08-28 DIAGNOSIS — R7301 Impaired fasting glucose: Secondary | ICD-10-CM | POA: Diagnosis not present

## 2019-08-28 DIAGNOSIS — E782 Mixed hyperlipidemia: Secondary | ICD-10-CM | POA: Diagnosis not present

## 2019-08-28 DIAGNOSIS — I10 Essential (primary) hypertension: Secondary | ICD-10-CM | POA: Diagnosis not present

## 2019-10-28 DIAGNOSIS — R7303 Prediabetes: Secondary | ICD-10-CM | POA: Diagnosis not present

## 2019-10-28 DIAGNOSIS — R7301 Impaired fasting glucose: Secondary | ICD-10-CM | POA: Diagnosis not present

## 2019-10-28 DIAGNOSIS — K219 Gastro-esophageal reflux disease without esophagitis: Secondary | ICD-10-CM | POA: Diagnosis not present

## 2019-10-28 DIAGNOSIS — I10 Essential (primary) hypertension: Secondary | ICD-10-CM | POA: Diagnosis not present

## 2019-10-28 DIAGNOSIS — E782 Mixed hyperlipidemia: Secondary | ICD-10-CM | POA: Diagnosis not present

## 2019-12-07 DIAGNOSIS — I129 Hypertensive chronic kidney disease with stage 1 through stage 4 chronic kidney disease, or unspecified chronic kidney disease: Secondary | ICD-10-CM | POA: Diagnosis not present

## 2019-12-07 DIAGNOSIS — I10 Essential (primary) hypertension: Secondary | ICD-10-CM | POA: Diagnosis not present

## 2019-12-07 DIAGNOSIS — R7301 Impaired fasting glucose: Secondary | ICD-10-CM | POA: Diagnosis not present

## 2019-12-07 DIAGNOSIS — E782 Mixed hyperlipidemia: Secondary | ICD-10-CM | POA: Diagnosis not present

## 2019-12-07 DIAGNOSIS — R7303 Prediabetes: Secondary | ICD-10-CM | POA: Diagnosis not present

## 2019-12-07 DIAGNOSIS — N183 Chronic kidney disease, stage 3 unspecified: Secondary | ICD-10-CM | POA: Diagnosis not present

## 2019-12-07 DIAGNOSIS — K219 Gastro-esophageal reflux disease without esophagitis: Secondary | ICD-10-CM | POA: Diagnosis not present

## 2019-12-28 DIAGNOSIS — N183 Chronic kidney disease, stage 3 unspecified: Secondary | ICD-10-CM | POA: Diagnosis not present

## 2019-12-28 DIAGNOSIS — E782 Mixed hyperlipidemia: Secondary | ICD-10-CM | POA: Diagnosis not present

## 2019-12-28 DIAGNOSIS — R7301 Impaired fasting glucose: Secondary | ICD-10-CM | POA: Diagnosis not present

## 2019-12-28 DIAGNOSIS — I10 Essential (primary) hypertension: Secondary | ICD-10-CM | POA: Diagnosis not present

## 2019-12-28 DIAGNOSIS — K219 Gastro-esophageal reflux disease without esophagitis: Secondary | ICD-10-CM | POA: Diagnosis not present

## 2019-12-28 DIAGNOSIS — E7849 Other hyperlipidemia: Secondary | ICD-10-CM | POA: Diagnosis not present

## 2019-12-28 DIAGNOSIS — I129 Hypertensive chronic kidney disease with stage 1 through stage 4 chronic kidney disease, or unspecified chronic kidney disease: Secondary | ICD-10-CM | POA: Diagnosis not present

## 2019-12-28 DIAGNOSIS — R7303 Prediabetes: Secondary | ICD-10-CM | POA: Diagnosis not present

## 2022-08-13 DIAGNOSIS — E663 Overweight: Secondary | ICD-10-CM | POA: Diagnosis not present

## 2022-08-13 DIAGNOSIS — B070 Plantar wart: Secondary | ICD-10-CM | POA: Diagnosis not present

## 2022-08-13 DIAGNOSIS — M25561 Pain in right knee: Secondary | ICD-10-CM | POA: Diagnosis not present

## 2022-08-13 DIAGNOSIS — Z6825 Body mass index (BMI) 25.0-25.9, adult: Secondary | ICD-10-CM | POA: Diagnosis not present

## 2022-09-26 DIAGNOSIS — B070 Plantar wart: Secondary | ICD-10-CM | POA: Diagnosis not present

## 2022-09-26 DIAGNOSIS — M79671 Pain in right foot: Secondary | ICD-10-CM | POA: Diagnosis not present

## 2023-01-02 DIAGNOSIS — J00 Acute nasopharyngitis [common cold]: Secondary | ICD-10-CM | POA: Diagnosis not present

## 2024-03-03 ENCOUNTER — Encounter: Payer: Self-pay | Admitting: Physician Assistant

## 2024-03-03 ENCOUNTER — Ambulatory Visit (INDEPENDENT_AMBULATORY_CARE_PROVIDER_SITE_OTHER): Admitting: Physician Assistant

## 2024-03-03 VITALS — BP 124/88 | HR 74 | Temp 98.2°F | Ht 62.0 in | Wt 142.4 lb

## 2024-03-03 DIAGNOSIS — M5030 Other cervical disc degeneration, unspecified cervical region: Secondary | ICD-10-CM

## 2024-03-03 DIAGNOSIS — N189 Chronic kidney disease, unspecified: Secondary | ICD-10-CM

## 2024-03-03 DIAGNOSIS — R7303 Prediabetes: Secondary | ICD-10-CM | POA: Insufficient documentation

## 2024-03-03 DIAGNOSIS — Z1231 Encounter for screening mammogram for malignant neoplasm of breast: Secondary | ICD-10-CM

## 2024-03-03 DIAGNOSIS — E785 Hyperlipidemia, unspecified: Secondary | ICD-10-CM

## 2024-03-03 DIAGNOSIS — I10 Essential (primary) hypertension: Secondary | ICD-10-CM

## 2024-03-03 DIAGNOSIS — Z7689 Persons encountering health services in other specified circumstances: Secondary | ICD-10-CM

## 2024-03-03 NOTE — Assessment & Plan Note (Signed)
 Stable. No current management, previously on simvastatin years ago. Discussed healthy diet and lifestyle. Lab work today. Will restart statin as indicated.

## 2024-03-03 NOTE — Assessment & Plan Note (Signed)
 Patient unaware of diagnosis. A1c today. Discussed diet and exercise.

## 2024-03-03 NOTE — Progress Notes (Signed)
 New Patient Office Visit  Subjective    Patient ID: Stephanie [name], female    DOB: 10/10/1956  Age: 68 y.o. MRN: [id number]  CC: No chief complaint on file.   HPI Stephanie [name] presents to establish care  Patient presents today with past medical history significant for hypertension, hyperlipidemia, chronic kidney disease, cervical disc disease, prediabetes, generalized anxiety, and chronic pain. She is not on daily medication. She reports feeling well. She has not seen a PCP since 2021. She does not have concerns or complaints today.   Chart review conducted, last readable records in 2017.  Last lab work also from 2017.  Outpatient Encounter Medications as of 03/03/2024  Medication Sig   [DISCONTINUED] ALPRAZolam (XANAX) 0.5 MG tablet Take 0.5 mg by mouth 2 (two) times daily as needed for anxiety (take as needed).  (Patient not taking: Reported on 03/03/2024)   [DISCONTINUED] amLODipine (NORVASC) 5 MG tablet Take 1 tablet (5 mg total) by mouth daily. (Patient not taking: Reported on 03/03/2024)   [DISCONTINUED] benzonatate (TESSALON) 200 MG capsule Take 1 capsule (200 mg total) by mouth 3 (three) times daily as needed for cough. (Patient not taking: Reported on 03/03/2024)   [DISCONTINUED] betamethasone dipropionate (DIPROLENE) 0.05 % cream Apply topically 2 (two) times daily. (Patient not taking: Reported on 03/03/2024)   [DISCONTINUED] buPROPion (WELLBUTRIN) 100 MG tablet Take 1 tablet by mouth 3 (three) times daily. (Patient not taking: Reported on 03/03/2024)   [DISCONTINUED] cetirizine (ZYRTEC) 10 MG tablet Take 1 tablet (10 mg total) by mouth daily. For allergies (Patient not taking: Reported on 03/03/2024)   [DISCONTINUED] cyclobenzaprine (FLEXERIL) 5 MG tablet Take 1 tablet (5 mg total) by mouth 3 (three) times daily as needed for muscle spasms. (Patient not taking: Reported on 03/03/2024)   [DISCONTINUED] DEXILANT 60 MG capsule TAKE 2 CAPSULES BY MOUTH DAILY. (Patient not taking: No sig reported)    [DISCONTINUED] docusate sodium (COLACE) 100 MG capsule Take 100 mg by mouth 2 (two) times daily. (Patient not taking: Reported on 03/03/2024)   [DISCONTINUED] fluticasone (FLONASE) 50 MCG/ACT nasal spray Place 2 sprays into both nostrils daily. (Patient not taking: Reported on 03/03/2024)   [DISCONTINUED] lisinopril (PRINIVIL,ZESTRIL) 10 MG tablet TAKE 1 TABLET BY MOUTH ONCE DAILY FOR BLOOD PRESSURE. (Patient not taking: Reported on 03/03/2024)   [DISCONTINUED] mirtazapine (REMERON) 15 MG tablet Take 45 mg by mouth at bedtime.  (Patient not taking: Reported on 03/03/2024)   [DISCONTINUED] mirtazapine (REMERON) 45 MG tablet TAKE 1 TABLET BY MOUTH AT BEDTIME FOR SLEEP. (Patient not taking: Reported on 03/03/2024)   [DISCONTINUED] PARoxetine (PAXIL) 20 MG tablet Take 1 tablet (20 mg total) by mouth daily. (Patient not taking: Reported on 03/03/2024)   [DISCONTINUED] simvastatin (ZOCOR) 20 MG tablet TAKE 1 TABLET BY MOUTH ONCE DAILY.   [DISCONTINUED] traMADol (ULTRAM) 50 MG tablet Take 50 mg by mouth 4 (four) times daily.   No facility-administered encounter medications on file as of 03/03/2024.    Past Medical History:  Diagnosis Date   Allergy    Anxiety    Arthritis    degenerative disc disease neck and low back   Chronic neck and back pain    Chronic pain    Depression    GERD (gastroesophageal reflux disease)    Hemorrhoids    Hypercholesterolemia    Hypertension    Osteoporosis     Past Surgical History:  Procedure Laterality Date   ABDOMINAL HYSTERECTOMY     APPENDECTOMY     CHOLECYSTECTOMY  COLONOSCOPY  2009   Dr. Jena Gauss: normal   COLONOSCOPY WITH PROPOFOL N/A 12/18/2015   ZOX:WRUE canal/internal hemorrhoids otherwise normal   ESOPHAGOGASTRODUODENOSCOPY  2009   Dr. Jena Gauss: normal, small hiatal hernia   HERNIA REPAIR     umbilical   NECK SURGERY     SPINE SURGERY     neck    Family History  Problem Relation Age of Onset   Parkinson's disease Father        Deceased   Colon  cancer Father        diagnosed in his 85s    Brain cancer Mother        Deceased   Cancer Mother 1       brain   COPD Sister    Cancer Brother        skin    Arthritis Brother        back problems   Seizures Daughter    Seizures Daughter     Social History   Socioeconomic History   Marital status: Married    Spouse name: Gardenia Phlegm   Number of children: 3   Years of education: 12   Highest education level: Not on file  Occupational History   Occupation: disabled    Comment: Chief Strategy Officer  Tobacco Use   Smoking status: Never   Smokeless tobacco: Never   Tobacco comments:    Never smoked  Vaping Use   Vaping status: Never Used  Substance and Sexual Activity   Alcohol use: Yes    Alcohol/week: 0.0 standard drinks of alcohol    Comment: social alcohol    Drug use: No   Sexual activity: Not Currently    Birth control/protection: Surgical  Other Topics Concern   Not on file  Social History Narrative   Not on file   Social Drivers of Health   Financial Resource Strain: Low Risk  (10/23/2017)   Overall Financial Resource Strain (CARDIA)    Difficulty of Paying Living Expenses: Not hard at all  Food Insecurity: No Food Insecurity (10/23/2017)   Hunger Vital Sign    Worried About Running Out of Food in the Last Year: Never true    Ran Out of Food in the Last Year: Never true  Transportation Needs: No Transportation Needs (10/23/2017)   PRAPARE - Administrator, Civil Service (Medical): No    Lack of Transportation (Non-Medical): No  Physical Activity: Sufficiently Active (10/23/2017)   Exercise Vital Sign    Days of Exercise per Week: 5 days    Minutes of Exercise per Session: 60 min  Stress: Stress Concern Present (10/23/2017)   [employer] of Occupational Health - Occupational Stress Questionnaire    Feeling of Stress : Rather much  Social Connections: Somewhat Isolated (10/23/2017)   Social Connection and Isolation Panel [NHANES]     Frequency of Communication with Friends and Family: Never    Frequency of Social Gatherings with Friends and Family: Once a week    Attends Religious Services: More than 4 times per year    Active Member of [employer] or Organizations: No    Attends Banker Meetings: Never    Marital Status: Married  Catering manager Violence: Not At Risk (10/23/2017)   Humiliation, Afraid, Rape, and Kick questionnaire    Fear of Current or Ex-Partner: No    Emotionally Abused: No    Physically Abused: No    Sexually Abused: No    Review of Systems  Constitutional:  Negative for chills, fever and weight loss.  Respiratory:  Negative for cough and shortness of breath.   Cardiovascular:  Negative for chest pain and palpitations.  Musculoskeletal:  Positive for falls, joint pain and neck pain. Negative for back pain.  Psychiatric/Behavioral:  Negative for depression. The patient is nervous/anxious.         Objective    BP 124/88   Pulse 74   Temp 98.2 F (36.8 C)   Ht 5\' 2"  (1.575 m)   Wt 142 lb 6.4 oz (64.6 kg)   SpO2 100%   BMI 26.05 kg/m   Physical Exam Constitutional:      Appearance: Normal appearance.  HENT:     Head: Normocephalic.     Mouth/Throat:     Mouth: Mucous membranes are moist.     Pharynx: Oropharynx is clear.  Eyes:     Extraocular Movements: Extraocular movements intact.     Conjunctiva/sclera: Conjunctivae normal.  Cardiovascular:     Rate and Rhythm: Normal rate and regular rhythm.     Heart sounds: Normal heart sounds. No murmur heard. Pulmonary:     Effort: Pulmonary effort is normal.     Breath sounds: Normal breath sounds.  Musculoskeletal:     Cervical back: Normal range of motion. No signs of trauma or rigidity. No pain with movement. Normal range of motion.  Skin:    General: Skin is warm and dry.  Neurological:     General: No focal deficit present.     Mental Status: She is alert and oriented to person, place, and time.  Psychiatric:         Mood and Affect: Mood normal.        Behavior: Behavior normal.        Assessment & Plan:  Encounter to establish care  Primary hypertension Assessment & Plan: 124/88 Controlled. No current medications. No change in management. Discussed DASH diet and dietary sodium restrictions.  Continue dietary efforts and physical activity.   Orders: -     CBC with Differential/Platelet -     CMP14+EGFR  Degenerative disc disease, cervical Assessment & Plan: Reports previous cervical spine surgery with neck plate implanted. Due to patient concerns and recent fall, will order cervical spine XR to check plate placement. She does not have neck pain or radicular symptoms today. Normal physical exam. Will refer to neurosurgery as needed based on X-rays or progression in symptoms.   Orders: -     DG Cervical Spine Complete  Chronic kidney disease, unspecified CKD stage Assessment & Plan: Patient unaware of this diagnosis. History of stage 3 kidney disease. Lab work today for creatinine and GFR. Will follow up in 3 months or sooner as needed based on lab work.   Orders: -     CMP14+EGFR  Prediabetes Assessment & Plan: Patient unaware of diagnosis. A1c today. Discussed diet and exercise.   Orders: -     Hemoglobin A1c  Hyperlipidemia, unspecified hyperlipidemia type Assessment & Plan: Stable. No current management, previously on simvastatin years ago. Discussed healthy diet and lifestyle. Lab work today. Will restart statin as indicated.   Orders: -     Lipid panel  Encounter for screening mammogram for malignant neoplasm of breast -     3D Screening Mammogram, Left and Right    Return in about 3 months (around 06/02/2024).   Toni Amend Mancel Lardizabal, PA-C

## 2024-03-03 NOTE — Assessment & Plan Note (Signed)
 Reports previous cervical spine surgery with neck plate implanted. Due to patient concerns and recent fall, will order cervical spine XR to check plate placement. She does not have neck pain or radicular symptoms today. Normal physical exam. Will refer to neurosurgery as needed based on X-rays or progression in symptoms.

## 2024-03-03 NOTE — Assessment & Plan Note (Signed)
 Patient unaware of this diagnosis. History of stage 3 kidney disease. Lab work today for creatinine and GFR. Will follow up in 3 months or sooner as needed based on lab work.

## 2024-03-03 NOTE — Assessment & Plan Note (Signed)
 124/88 Controlled. No current medications. No change in management. Discussed DASH diet and dietary sodium restrictions.  Continue dietary efforts and physical activity.

## 2024-03-04 ENCOUNTER — Other Ambulatory Visit: Payer: Self-pay | Admitting: Physician Assistant

## 2024-03-04 DIAGNOSIS — E785 Hyperlipidemia, unspecified: Secondary | ICD-10-CM

## 2024-03-04 LAB — CBC WITH DIFFERENTIAL/PLATELET
Basophils Absolute: 0 10*3/uL (ref 0.0–0.2)
Basos: 1 %
EOS (ABSOLUTE): 0.1 10*3/uL (ref 0.0–0.4)
Eos: 1 %
Hematocrit: 41.8 % (ref 34.0–46.6)
Hemoglobin: 13.7 g/dL (ref 11.1–15.9)
Immature Grans (Abs): 0 10*3/uL (ref 0.0–0.1)
Immature Granulocytes: 0 %
Lymphocytes Absolute: 2.4 10*3/uL (ref 0.7–3.1)
Lymphs: 36 %
MCH: 29.3 pg (ref 26.6–33.0)
MCHC: 32.8 g/dL (ref 31.5–35.7)
MCV: 89 fL (ref 79–97)
Monocytes Absolute: 0.4 10*3/uL (ref 0.1–0.9)
Monocytes: 6 %
Neutrophils Absolute: 3.7 10*3/uL (ref 1.4–7.0)
Neutrophils: 56 %
Platelets: 265 10*3/uL (ref 150–450)
RBC: 4.68 x10E6/uL (ref 3.77–5.28)
RDW: 14 % (ref 11.7–15.4)
WBC: 6.6 10*3/uL (ref 3.4–10.8)

## 2024-03-04 LAB — CMP14+EGFR
ALT: 16 IU/L (ref 0–32)
AST: 15 IU/L (ref 0–40)
Albumin: 4.3 g/dL (ref 3.9–4.9)
Alkaline Phosphatase: 86 IU/L (ref 44–121)
BUN/Creatinine Ratio: 11 — ABNORMAL LOW (ref 12–28)
BUN: 11 mg/dL (ref 8–27)
Bilirubin Total: <0.2 mg/dL (ref 0.0–1.2)
CO2: 23 mmol/L (ref 20–29)
Calcium: 9.5 mg/dL (ref 8.7–10.3)
Chloride: 103 mmol/L (ref 96–106)
Creatinine, Ser: 1.03 mg/dL — ABNORMAL HIGH (ref 0.57–1.00)
Globulin, Total: 2.6 g/dL (ref 1.5–4.5)
Glucose: 82 mg/dL (ref 70–99)
Potassium: 4.3 mmol/L (ref 3.5–5.2)
Sodium: 143 mmol/L (ref 134–144)
Total Protein: 6.9 g/dL (ref 6.0–8.5)
eGFR: 60 mL/min/{1.73_m2} (ref >59)

## 2024-03-04 LAB — LIPID PANEL
Chol/HDL Ratio: 5.5 ratio — ABNORMAL HIGH (ref 0.0–4.4)
Cholesterol, Total: 258 mg/dL — ABNORMAL HIGH (ref 100–199)
HDL: 47 mg/dL (ref >39)
LDL Chol Calc (NIH): 146 mg/dL — ABNORMAL HIGH (ref 0–99)
Triglycerides: 353 mg/dL — ABNORMAL HIGH (ref 0–149)
VLDL Cholesterol Cal: 65 mg/dL — ABNORMAL HIGH (ref 5–40)

## 2024-03-04 LAB — HEMOGLOBIN A1C
Est. average glucose Bld gHb Est-mCnc: 117 mg/dL
Hgb A1c MFr Bld: 5.7 % — ABNORMAL HIGH (ref 4.8–5.6)

## 2024-03-04 MED ORDER — ROSUVASTATIN CALCIUM 10 MG PO TABS: 10.0000 mg | ORAL_TABLET | Freq: Every day | ORAL | 3 refills | Status: DC

## 2024-05-31 ENCOUNTER — Ambulatory Visit (HOSPITAL_COMMUNITY)
Admission: RE | Admit: 2024-05-31 | Discharge: 2024-05-31 | Disposition: A | Discharge status: Home / Self Care | Source: Ambulatory Visit | Attending: Physician Assistant | Admitting: Physician Assistant

## 2024-05-31 DIAGNOSIS — M5030 Other cervical disc degeneration, unspecified cervical region: Secondary | ICD-10-CM | POA: Diagnosis not present

## 2024-05-31 DIAGNOSIS — Z981 Arthrodesis status: Secondary | ICD-10-CM | POA: Diagnosis not present

## 2024-05-31 DIAGNOSIS — M47812 Spondylosis without myelopathy or radiculopathy, cervical region: Secondary | ICD-10-CM | POA: Diagnosis not present

## 2024-05-31 DIAGNOSIS — Z1231 Encounter for screening mammogram for malignant neoplasm of breast: Secondary | ICD-10-CM | POA: Insufficient documentation

## 2024-06-02 ENCOUNTER — Ambulatory Visit: Admitting: Physician Assistant

## 2024-06-03 ENCOUNTER — Ambulatory Visit: Payer: Self-pay | Admitting: Physician Assistant

## 2024-06-09 ENCOUNTER — Ambulatory Visit (INDEPENDENT_AMBULATORY_CARE_PROVIDER_SITE_OTHER): Admitting: Physician Assistant

## 2024-06-09 ENCOUNTER — Encounter: Payer: Self-pay | Admitting: Physician Assistant

## 2024-06-09 VITALS — BP 137/83 | HR 71 | Temp 97.9°F | Ht 62.0 in | Wt 141.0 lb

## 2024-06-09 DIAGNOSIS — E782 Mixed hyperlipidemia: Secondary | ICD-10-CM | POA: Diagnosis not present

## 2024-06-09 DIAGNOSIS — M5030 Other cervical disc degeneration, unspecified cervical region: Secondary | ICD-10-CM | POA: Diagnosis not present

## 2024-06-09 NOTE — Progress Notes (Signed)
 Established Patient Office Visit  Subjective   Patient ID: Stephanie [name], female    DOB: Mar 19, 1956  Age: 68 y.o. MRN: [id number]  Chief Complaint  Patient presents with   Follow-up    3 month f/u     Patient presents today for follow up regarding lab work and neck pain. She relates significant family and financial stressors in her life currently. She denies concerns or complaints. Patient relates daily compliance with Crestor , and denies adverse effects. She reports previous surgical intervention for neck pain, and relates she was once told if I can hear the bones in my neck when I move, I need to see a surgeon, and I can hear bones moving. Denies neck pain or radicular symptoms. Recent cervical spine XR reviewed with patient. She relates recent fall that has her concerned for her neck.      Review of Systems  Constitutional:  Negative for chills, fever, malaise/fatigue and weight loss.  Musculoskeletal:  Positive for back pain, falls, joint pain and neck pain. Negative for myalgias.  Neurological:  Positive for tremors and headaches. Negative for dizziness, sensory change, loss of consciousness and weakness.  Psychiatric/Behavioral:  Negative for depression, hallucinations and substance abuse. The patient is nervous/anxious.       Objective:     BP 137/83   Pulse 71   Temp 97.9 F (36.6 C)   Ht 5' 2 (1.575 m)   Wt 141 lb (64 kg)   SpO2 97%   BMI 25.79 kg/m    Physical Exam Constitutional:      Appearance: Normal appearance.  HENT:     Head: Normocephalic.     Mouth/Throat:     Mouth: Mucous membranes are moist.     Pharynx: Oropharynx is clear.  Eyes:     Extraocular Movements: Extraocular movements intact.     Conjunctiva/sclera: Conjunctivae normal.  Cardiovascular:     Rate and Rhythm: Normal rate and regular rhythm.     Heart sounds: Normal heart sounds. No murmur heard. Pulmonary:     Effort: Pulmonary effort is normal.     Breath sounds: Normal breath  sounds. No wheezing or rales.  Musculoskeletal:     Cervical back: No swelling, deformity, signs of trauma, tenderness or bony tenderness. Decreased range of motion.  Skin:    General: Skin is warm and dry.  Neurological:     General: No focal deficit present.     Mental Status: She is alert and oriented to person, place, and time.     Cranial Nerves: No cranial nerve deficit, dysarthria or facial asymmetry.     Motor: No weakness.     Gait: Gait is intact.  Psychiatric:        Mood and Affect: Mood normal.        Behavior: Behavior normal.     No results found for any visits on 06/09/24.  The 10-year ASCVD risk score (Arnett DK, et al., 2019) is: 12.3%    Assessment & Plan:   Return in about 6 months (around 12/10/2024), or sooner as needed.   Degenerative disc disease, cervical Assessment & Plan: Patient presents today with concerns regarding the plate in her cervical spine. Upon chart review patient with previous cervical spine fusion. Recent cervical spine XR noting fusion stable compared to past. She does not have neck pain or radicular symptoms today. Normal physical exam, mild decreased ROM, likely related to fusion. Referral to neurosurgery for evaluation of neck pain considered fall and  previous surgical intervention. Follow up and ER precautions discussed.   Orders: -     Ambulatory referral to Neurosurgery  Mixed hyperlipidemia Assessment & Plan: Stable. Continue with current management without changes. Discussed healthy diet and lifestyle. Denies adverse effects related to Crestor .      Charmaine Gray Doering, PA-C

## 2024-06-09 NOTE — Assessment & Plan Note (Signed)
 Stable. Continue with current management without changes. Discussed healthy diet and lifestyle. Denies adverse effects related to Crestor .

## 2024-06-09 NOTE — Assessment & Plan Note (Addendum)
 Patient presents today with concerns regarding the plate in her cervical spine. Upon chart review patient with previous cervical spine fusion. Recent cervical spine XR noting fusion stable compared to past. She does not have neck pain or radicular symptoms today. Normal physical exam, mild decreased ROM, likely related to fusion. Referral to neurosurgery for evaluation of neck pain considered fall and previous surgical intervention. Follow up and ER precautions discussed.

## 2024-07-20 ENCOUNTER — Encounter: Payer: Self-pay | Admitting: Orthopedic Surgery

## 2024-07-20 NOTE — Progress Notes (Unsigned)
 Referring Physician:  Grooms, Charmaine, PA-C [address] [place],  [place] [address]  Primary Physician:  Grooms, [place], [place]  History of Present Illness: 07/27/2024 Ms. Stephanie [name] has a history of HTN, CKD, chronic pain syndrome, GAD, hyperlipidemia, obesity, prediabetes.   History of cervical fusion C3-T1 years ago. She did improve after her surgery.   She has intermittent posterior neck pain x years. She has numbness/tingling in both arms that is constant. She has intermittent pain in right arm. Pain is worse with housework and better with resting. She has weakness in her arms- she is dropping things. No new balance issues.   Has anaphylactic reaction to prednisone.   Tobacco use: Does not smoke.   Bowel/Bladder Dysfunction: none  Conservative measures:  Physical therapy: has not participated in recently Multimodal medical therapy including regular antiinflammatories: tylenol , ibuprofen, gabapentin ,  Injections: no epidural steroid injections  Past Surgery:  Cervical Fusion C3-T1 by Dr. Gillie Arland VEAR [place] has no symptoms of cervical myelopathy.  The symptoms are causing a significant impact on the patient's life.   Review of Systems:  A 10 point review of systems is negative, except for the pertinent positives and negatives detailed in the HPI.  Past Medical History: Past Medical History:  Diagnosis Date   Allergy    Anxiety    Arthritis    degenerative disc disease neck and low back   Chronic neck and back pain    Chronic pain    Depression    GERD (gastroesophageal reflux disease)    Hemorrhoids    Hypercholesterolemia    Hypertension    Osteoporosis     Past Surgical History: Past Surgical History:  Procedure Laterality Date   ABDOMINAL HYSTERECTOMY     APPENDECTOMY     CHOLECYSTECTOMY     COLONOSCOPY  2009   Dr. Shaaron: normal   COLONOSCOPY WITH PROPOFOL  N/A 12/18/2015   MFM:jwjo canal/internal hemorrhoids otherwise normal    ESOPHAGOGASTRODUODENOSCOPY  2009   Dr. Shaaron: normal, small hiatal hernia   HERNIA REPAIR     umbilical   NECK SURGERY     SPINE SURGERY     neck    Allergies: Allergies as of 07/27/2024 - Review Complete 06/09/2024  Allergen Reaction Noted   Prednisone Anaphylaxis 09/19/2014   Gabapentin  Swelling 07/27/2024    Medications: Outpatient Encounter Medications as of 07/27/2024  Medication Sig   aspirin  81 MG chewable tablet Chew 81 mg by mouth daily.   rosuvastatin  (CRESTOR ) 10 MG tablet Take 1 tablet (10 mg total) by mouth daily.   No facility-administered encounter medications on file as of 07/27/2024.    Social History: Social History   Tobacco Use   Smoking status: Never   Smokeless tobacco: Never   Tobacco comments:    Never smoked  Vaping Use   Vaping status: Never Used  Substance Use Topics   Alcohol use: Yes    Alcohol/week: 0.0 standard drinks of alcohol    Comment: social alcohol    Drug use: No    Family Medical History: Family History  Problem Relation Age of Onset   Parkinson's disease Father        Deceased   Colon cancer Father        diagnosed in his 55s    Brain cancer Mother        Deceased   Cancer Mother 34       brain   COPD Sister    Cancer Brother  skin    Arthritis Brother        back problems   Seizures Daughter    Seizures Daughter     Physical Examination: There were no vitals filed for this visit.  General: Patient is well developed, well nourished, calm, collected, and in no apparent distress. Attention to examination is appropriate.  Respiratory: Patient is breathing without any difficulty.   NEUROLOGICAL:     Awake, alert, oriented to person, place, and time.  Speech is clear and fluent. Fund of knowledge is appropriate.   Cranial Nerves: Pupils equal round and reactive to light.  Facial tone is symmetric.    No posterior cervical tenderness.   Good ROM of both shoulders with mild pain in left shoulder.   No  abnormal lesions on exposed skin.   Strength: Side Biceps Triceps Deltoid Interossei Grip Wrist Ext. Wrist Flex.  R 5 5 5 5 5 5 5   L 5 5 5 5 5 5 5    Side Iliopsoas Quads Hamstring PF DF EHL  R 5 5 5 5 5 5   L 5 5 5 5 5 5    Reflexes are 2+ and symmetric at the biceps, brachioradialis, patella and achilles.   Hoffman's is absent.  Clonus is not present.   Bilateral upper and lower extremity sensation is intact to light touch, but diminished in bilateral upper > lower extremities.   No pain with IR/ER of both hips.   Gait is normal.    Medical Decision Making  Imaging: Cervical xrays dated 05/31/24:  FINDINGS: Status post anterior interbody fusion of C3-T1. Stable appearance of plate and screws. Osteoarthritic changes of the right neural foramina predominantly at the level of C2-C3 and C4-C5. There is no evidence of cervical spine fracture or prevertebral soft tissue swelling. Alignment is normal. No other significant bone abnormalities are identified.   IMPRESSION: Anterior cervical fusion device C3-T1, stable to prior.   No acute fracture or subluxation.     Electronically Signed   By: Megan  Zare M.D.   On: 05/31/2024 11:43  I have personally reviewed the images and agree with the above interpretation.  Assessment and Plan: [name] has a history of cervical fusion C3-T1 years ago. She did improve after her surgery.   She has intermittent posterior neck pain x years. She has numbness/tingling in both arms that is constant. She has intermittent pain in right arm. She has weakness in her arms- she is dropping things. No new balance issues.   Cervical xrays showe ACDF C3-T1. No signs of cervical myelopathy.   Treatment options discussed with patient and following plan made:   - CT scan of cervical spine to further evaluate neck pain.  - EMG/NCS of bilateral upper extremities to evaluate bilateral arm numbness/tingling. Orders to [facility].  - She would like to  schedule follow up to discuss results when both CT/EMG are done. Will call with CT results if anything concerning.   BP was elevated.  No symptoms of chest pain, shortness of breath, blurry vision, or headaches. She will recheck at home and call PCP if not improved. If she develops CP, SOB, blurry vision, or headaches, then she will go to ED.     I spent a total of 35 minutes in face-to-face and non-face-to-face activities related to this patient's care today including review of outside records, review of imaging, review of symptoms, physical exam, discussion of differential diagnosis, discussion of treatment options, and documentation.   Thank you for involving  me in the care of this patient.   Glade Boys PA-C Dept. of Neurosurgery

## 2024-07-27 ENCOUNTER — Ambulatory Visit: Admitting: Orthopedic Surgery

## 2024-07-27 ENCOUNTER — Encounter: Payer: Self-pay | Admitting: Orthopedic Surgery

## 2024-07-27 VITALS — BP 176/98 | Ht 62.0 in | Wt 144.0 lb

## 2024-07-27 DIAGNOSIS — Z981 Arthrodesis status: Secondary | ICD-10-CM | POA: Diagnosis not present

## 2024-07-27 DIAGNOSIS — M542 Cervicalgia: Secondary | ICD-10-CM

## 2024-07-27 DIAGNOSIS — R030 Elevated blood-pressure reading, without diagnosis of hypertension: Secondary | ICD-10-CM

## 2024-07-27 DIAGNOSIS — R200 Anesthesia of skin: Secondary | ICD-10-CM

## 2024-07-27 DIAGNOSIS — R202 Paresthesia of skin: Secondary | ICD-10-CM

## 2024-07-27 NOTE — Patient Instructions (Addendum)
 It was so nice to see you today. Thank you so much for coming in.    You are fused from C3-T1. Your xrays look okay.   I want to get a CT  of your neck to look into things further. We will get this approved through your insurance and Zelda Salmon will call you to schedule the appointment. Ask about your patient responsibility. You do not need to pay this prior to getting MRI, they can bill you.   After you have the CT, it can take 14-28 days for me to get the results back. If I don't have them in 2 weeks, we will call to try to get the results.   I want to get an EMG (nerve conduction test) to look into things further (numbness/tingling in your arms). I have ordered this and [facility] will call you to schedule. You can also call them at [phone].   Once I have the results for the CT and EMG, we will call you to schedule a follow up  visit with me to review them.   Please do not hesitate to call if you have any questions or concerns. You can also message me in MyChart.   Your blood pressure was elevated today. I want you to recheck it at home and follow up with your PCP if it remains high. If you have any chest pain, shortness of breath, blurry vision, or headaches then you need to go to ED.    Glade Boys PA-C [phone]     The physicians and staff at [facility] at [facility] are committed to providing excellent care. You may receive a survey asking for feedback about your experience at our office. We value you your feedback and appreciate you taking the time to to fill it out. The [facility] leadership team is also available to discuss your experience in person, feel free to contact us  [phone].

## 2024-10-27 ENCOUNTER — Other Ambulatory Visit: Payer: Self-pay

## 2024-10-27 DIAGNOSIS — R202 Paresthesia of skin: Secondary | ICD-10-CM

## 2024-11-04 ENCOUNTER — Telehealth: Payer: Self-pay

## 2024-11-04 ENCOUNTER — Ambulatory Visit: Admitting: Neurology

## 2024-11-04 DIAGNOSIS — R202 Paresthesia of skin: Secondary | ICD-10-CM

## 2024-11-04 NOTE — Telephone Encounter (Addendum)
 Attempted to call patient. Went to [employer], voicemail full.    ----- Message from HILMA HASTINGS M sent at 11/04/2024  4:09 PM EST ----- Her EMG is done. I also ordered cervical CT scan. This has not been scheduled. Can you call her and see if she still wants to get this done?   Thanks!

## 2024-11-04 NOTE — Procedures (Signed)
 [facility]  [address] [place], [address]  [place], [place] [address] Tel: [phone] Fax: [phone] Test Date:  11/04/2024  Patient: Stephanie [name] DOB: July 20, 1956 Physician: Tonita Blanch, DO  Sex: Female Height: 5' 2 Ref Phys: Glade Boys, DEVONNA  ID#: [id number]   Technician:    History: This is a 68 year old female with history of cervical surgery referred for evaluation of bilateral upper extremity pain.  NCV & EMG Findings: Extensive electrodiagnostic testing of the right upper extremity and additional studies of the left shows:  Bilateral median, ulnar, and mixed palmar sensory responses are within normal limits. Bilateral median and ulnar motor responses are within normal limits. There is no evidence of active or chronic motor axonal loss changes affecting any of the tested muscles.  Motor unit configuration and recruitment pattern is within normal limits.  Impression: This is a normal study of the upper extremities.  In particular, there is no evidence of carpal tunnel syndrome or a cervical radiculopathy.   ___________________________ Tonita Blanch, DO    Nerve Conduction Studies   Stim Site NR Peak (ms) Norm Peak (ms) O-P Amp (V) Norm O-P Amp  Left Median Anti Sensory (2nd Digit)  32 C  Wrist    2.5 <3.8 41.7 >10  Right Median Anti Sensory (2nd Digit)  32 C  Wrist    2.6 <3.8 39.6 >10  Left Ulnar Anti Sensory (5th Digit)  32 C  Wrist    2.5 <3.2 37.1 >5  Right Ulnar Anti Sensory (5th Digit)  32 C  Wrist    2.5 <3.2 30.9 >5     Stim Site NR Onset (ms) Norm Onset (ms) O-P Amp (mV) Norm O-P Amp Site1 Site2 Delta-0 (ms) Dist (cm) Vel (m/s) Norm Vel (m/s)  Left Median Motor (Abd Poll Brev)  32 C  Wrist    2.4 <4.0 11.5 >5 Elbow Wrist 4.5 27.0 60 >50  Elbow    6.9  11.4         Right Median Motor (Abd Poll Brev)  32 C  Wrist    2.4 <4.0 10.2 >5 Elbow Wrist 4.6 28.0 61 >50  Elbow    7.0  9.4         Left Ulnar Motor (Abd Dig Minimi)  32 C  Wrist     2.0 <3.1 10.7 >7 B Elbow Wrist 3.0 20.0 67 >50  B Elbow    5.0  10.0  A Elbow B Elbow 1.6 10.0 63 >50  A Elbow    6.6  9.6         Right Ulnar Motor (Abd Dig Minimi)  32 C  Wrist    2.0 <3.1 10.7 >7 B Elbow Wrist 3.2 20.0 63 >50  B Elbow    5.2  10.3  A Elbow B Elbow 1.4 10.0 71 >50  A Elbow    6.6  10.0            Stim Site NR Peak (ms) Norm Peak (ms) P-T Amp (V) Site1 Site2 Delta-P (ms) Norm Delta (ms)  Left Median/Ulnar Palm Comparison (Wrist - 8cm)  32 C  Median Palm    1.3 <2.2 100.4 Median Palm Ulnar Palm 0.1   Ulnar Palm    1.2 <2.2 21.0      Right Median/Ulnar Palm Comparison (Wrist - 8cm)  32 C  Median Palm    1.4 <2.2 87.7 Median Palm Ulnar Palm 0.1   Ulnar Palm    1.5 <2.2 19.9  Electromyography   Side Muscle Ins.Act Fibs Fasc Recrt Amp Dur Poly Activation Comment  Right 1stDorInt Nml Nml Nml Nml Nml Nml Nml Nml N/A  Right Abd Poll Brev Nml Nml Nml Nml Nml Nml Nml Nml N/A  Right PronatorTeres Nml Nml Nml Nml Nml Nml Nml Nml N/A  Right Biceps Nml Nml Nml Nml Nml Nml Nml Nml N/A  Right Triceps Nml Nml Nml Nml Nml Nml Nml Nml N/A  Right Deltoid Nml Nml Nml Nml Nml Nml Nml Nml N/A  Left 1stDorInt Nml Nml Nml Nml Nml Nml Nml Nml N/A  Left Abd Poll Brev Nml Nml Nml Nml Nml Nml Nml Nml N/A  Left PronatorTeres Nml Nml Nml Nml Nml Nml Nml Nml N/A  Left Biceps Nml Nml Nml Nml Nml Nml Nml Nml N/A  Left Triceps Nml Nml Nml Nml Nml Nml Nml Nml N/A  Left Deltoid Nml Nml Nml Nml Nml Nml Nml Nml N/A      Waveforms:

## 2024-11-04 NOTE — Telephone Encounter (Deleted)
Attempted to call patient. Went to [employer], voicemail full.

## 2024-11-05 NOTE — Telephone Encounter (Signed)
 I spoke with the patient and she would like to have the CT done.   They did attempt to call her but it said the voicemail was full per the note. I have sent another message to scheduling and also gave her the number to call.

## 2024-12-10 ENCOUNTER — Encounter: Payer: Self-pay | Admitting: Physician Assistant

## 2024-12-10 ENCOUNTER — Ambulatory Visit: Admitting: Physician Assistant

## 2024-12-10 VITALS — BP 178/102 | HR 86 | Temp 97.3°F | Ht 62.0 in | Wt 141.6 lb

## 2024-12-10 DIAGNOSIS — I10 Essential (primary) hypertension: Secondary | ICD-10-CM | POA: Diagnosis not present

## 2024-12-10 DIAGNOSIS — E785 Hyperlipidemia, unspecified: Secondary | ICD-10-CM

## 2024-12-10 DIAGNOSIS — R7303 Prediabetes: Secondary | ICD-10-CM | POA: Diagnosis not present

## 2024-12-10 MED ORDER — VALSARTAN 80 MG PO TABS: 80.0000 mg | ORAL_TABLET | Freq: Every day | ORAL | 3 refills | Status: AC

## 2024-12-10 MED ORDER — ROSUVASTATIN CALCIUM 10 MG PO TABS: 10.0000 mg | ORAL_TABLET | Freq: Every day | ORAL | 3 refills | Status: AC

## 2024-12-10 NOTE — Assessment & Plan Note (Signed)
 Blood pressure remains elevated, potentially contributing to headaches. No current antihypertensive medication. No symptoms of chest pain, shortness of breath, or vision changes. - Prescribed valsartan  to be taken nightly with Crestor  to encourage adherence. - Scheduled follow-up in one month to reassess blood pressure or sooner for new chest pain, shortness of breath, visual changes, or worsening headaches.

## 2024-12-10 NOTE — Progress Notes (Signed)
 "  Established Patient Office Visit  Subjective   Patient ID: Stephanie [name], female    DOB: Nov 23, 1956  Age: 69 y.o. MRN: [id number]  Chief Complaint  Patient presents with   Follow-up    No concerns    Discussed the use of AI scribe software for clinical note transcription with the patient, who gave verbal consent to proceed.  History of Present Illness Stephanie [name] is a 69 year old female who presents for a six-month follow-up visit.  She is currently undergoing a work-up with neurosurgery, including a pending CT scan of her neck. She is unsure of the schedule for the CT scan and has not been in contact with neurosurgery regarding this.  Her blood pressure has been elevated, but she does not monitor it at home and is not on any antihypertensive medications. She denies chest pain, shortness of breath, or vision changes.  For cholesterol management, she has been prescribed Crestor  but is unsure of the timing for taking it and how not been taking it recently. She tends to take it when she sees it, and it is kept beside her bed.   She experiences numbness and chapping of her bottom lip, ongoing since a dental procedure. She has tried various lip balms without relief.  She has been experiencing headaches for the past month, described as sudden, painful, and short-lived, occurring in specific areas of her head and resolving quickly without associated vision changes.  Her diet is not specific, and she reports eating whatever is available. She mentions having a small appetite, stating 'two teaspoons would get me full.'    Review of Systems  Constitutional:  Negative for activity change, appetite change, fatigue and fever.  Eyes:  Negative for visual disturbance.  Respiratory:  Negative for cough and shortness of breath.   Cardiovascular:  Negative for chest pain.  Gastrointestinal:  Negative for constipation, diarrhea and vomiting.  Musculoskeletal:  Positive for neck pain.  Neurological:   Positive for headaches. Negative for light-headedness.       Objective:     BP (!) 178/102 (BP Location: Left Arm, Cuff Size: Normal)   Pulse 86   Temp (!) 97.3 F (36.3 C)   Ht 5' 2 (1.575 m)   Wt 141 lb 9.6 oz (64.2 kg)   SpO2 95%   BMI 25.90 kg/m    Physical Exam Constitutional:      General: She is not in acute distress.    Appearance: Normal appearance. She is not ill-appearing.  HENT:     Head: Normocephalic and atraumatic.     Mouth/Throat:     Mouth: Mucous membranes are moist.     Pharynx: Oropharynx is clear.  Eyes:     Extraocular Movements: Extraocular movements intact.     Conjunctiva/sclera: Conjunctivae normal.     Pupils: Pupils are equal, round, and reactive to light.  Cardiovascular:     Rate and Rhythm: Normal rate and regular rhythm.     Heart sounds: Normal heart sounds. No murmur heard. Pulmonary:     Effort: Pulmonary effort is normal.     Breath sounds: Normal breath sounds. No wheezing, rhonchi or rales.  Musculoskeletal:     Right lower leg: No edema.     Left lower leg: No edema.  Skin:    General: Skin is warm and dry.  Neurological:     General: No focal deficit present.     Mental Status: She is alert and oriented to person, place,  and time.  Psychiatric:        Mood and Affect: Mood normal.        Behavior: Behavior normal.     No results found for any visits on 12/10/24.  The 10-year ASCVD risk score (Arnett DK, et al., 2019) is: 19.9%    Assessment & Plan:   Return in about 4 weeks (around 01/07/2025) for BP.   Primary hypertension Assessment & Plan: Blood pressure remains elevated, potentially contributing to headaches. No current antihypertensive medication. No symptoms of chest pain, shortness of breath, or vision changes. - Prescribed valsartan  to be taken nightly with Crestor  to encourage adherence. - Scheduled follow-up in one month to reassess blood pressure or sooner for new chest pain, shortness of breath,  visual changes, or worsening headaches.   Orders: -     CMP14+EGFR -     CBC with Differential/Platelet -     Valsartan ; Take 1 tablet (80 mg total) by mouth daily.  Dispense: 90 tablet; Refill: 3  Hyperlipidemia, unspecified hyperlipidemia type Assessment & Plan: Currently on Crestor  with inconsistent adherence. No recent cholesterol levels available. - Refilled Crestor  prescription. - Ordered lab work to recheck cholesterol levels. - Scheduled follow-up in one month to review lab results.   Orders: -     Rosuvastatin  Calcium ; Take 1 tablet (10 mg total) by mouth daily.  Dispense: 90 tablet; Refill: 3 -     Lipid panel  Prediabetes -     Hemoglobin A1c   Alawna Graybeal, PA-C "

## 2024-12-10 NOTE — Assessment & Plan Note (Signed)
 Currently on Crestor  with inconsistent adherence. No recent cholesterol levels available. - Refilled Crestor  prescription. - Ordered lab work to recheck cholesterol levels. - Scheduled follow-up in one month to review lab results.

## 2024-12-11 LAB — CBC WITH DIFFERENTIAL/PLATELET
Basophils Absolute: 0.1 x10E3/uL (ref 0.0–0.2)
Basos: 1 %
EOS (ABSOLUTE): 0.1 x10E3/uL (ref 0.0–0.4)
Eos: 2 %
Hematocrit: 41.8 % (ref 34.0–46.6)
Hemoglobin: 13.8 g/dL (ref 11.1–15.9)
Immature Grans (Abs): 0 x10E3/uL (ref 0.0–0.1)
Immature Granulocytes: 0 %
Lymphocytes Absolute: 2.8 x10E3/uL (ref 0.7–3.1)
Lymphs: 41 %
MCH: 29.5 pg (ref 26.6–33.0)
MCHC: 33 g/dL (ref 31.5–35.7)
MCV: 89 fL (ref 79–97)
Monocytes Absolute: 0.3 x10E3/uL (ref 0.1–0.9)
Monocytes: 4 %
Neutrophils Absolute: 3.6 x10E3/uL (ref 1.4–7.0)
Neutrophils: 52 %
Platelets: 305 x10E3/uL (ref 150–450)
RBC: 4.68 x10E6/uL (ref 3.77–5.28)
RDW: 13.5 % (ref 11.7–15.4)
WBC: 6.9 x10E3/uL (ref 3.4–10.8)

## 2024-12-11 LAB — CMP14+EGFR
ALT: 11 IU/L (ref 0–32)
AST: 14 IU/L (ref 0–40)
Albumin: 4.3 g/dL (ref 3.9–4.9)
Alkaline Phosphatase: 90 IU/L (ref 49–135)
BUN/Creatinine Ratio: 16 (ref 12–28)
BUN: 19 mg/dL (ref 8–27)
Bilirubin Total: <0.2 mg/dL (ref 0.0–1.2)
CO2: 23 mmol/L (ref 20–29)
Calcium: 9.1 mg/dL (ref 8.7–10.3)
Chloride: 104 mmol/L (ref 96–106)
Creatinine, Ser: 1.17 mg/dL — ABNORMAL HIGH (ref 0.57–1.00)
Globulin, Total: 2.8 g/dL (ref 1.5–4.5)
Glucose: 84 mg/dL (ref 70–99)
Potassium: 4 mmol/L (ref 3.5–5.2)
Sodium: 143 mmol/L (ref 134–144)
Total Protein: 7.1 g/dL (ref 6.0–8.5)
eGFR: 51 mL/min/1.73 — ABNORMAL LOW

## 2024-12-11 LAB — LIPID PANEL
Chol/HDL Ratio: 5.6 ratio — ABNORMAL HIGH (ref 0.0–4.4)
Cholesterol, Total: 265 mg/dL — ABNORMAL HIGH (ref 100–199)
HDL: 47 mg/dL
LDL Chol Calc (NIH): 165 mg/dL — ABNORMAL HIGH (ref 0–99)
Triglycerides: 283 mg/dL — ABNORMAL HIGH (ref 0–149)
VLDL Cholesterol Cal: 53 mg/dL — ABNORMAL HIGH (ref 5–40)

## 2024-12-11 LAB — HEMOGLOBIN A1C
Est. average glucose Bld gHb Est-mCnc: 111 mg/dL
Hgb A1c MFr Bld: 5.5 % (ref 4.8–5.6)

## 2024-12-15 ENCOUNTER — Ambulatory Visit: Payer: Self-pay | Admitting: Physician Assistant

## 2024-12-17 ENCOUNTER — Other Ambulatory Visit: Payer: Self-pay | Admitting: Physician Assistant

## 2024-12-17 DIAGNOSIS — I10 Essential (primary) hypertension: Secondary | ICD-10-CM

## 2025-01-07 ENCOUNTER — Ambulatory Visit: Admitting: Physician Assistant
